# Patient Record
Sex: Female | Born: 1945 | Race: White | Hispanic: No | Marital: Married | Smoking: Never smoker
Health system: Southern US, Community
[De-identification: ages and names within clinical notes are randomized; demographics above are authoritative.]

## PROBLEM LIST (undated history)

## (undated) DIAGNOSIS — M5430 Sciatica, unspecified side: Secondary | ICD-10-CM

## (undated) DIAGNOSIS — E041 Nontoxic single thyroid nodule: Secondary | ICD-10-CM

## (undated) DIAGNOSIS — M1711 Unilateral primary osteoarthritis, right knee: Secondary | ICD-10-CM

## (undated) DIAGNOSIS — R519 Headache, unspecified: Secondary | ICD-10-CM

## (undated) DIAGNOSIS — R7881 Bacteremia: Secondary | ICD-10-CM

## (undated) DIAGNOSIS — M48062 Spinal stenosis, lumbar region with neurogenic claudication: Secondary | ICD-10-CM

## (undated) DIAGNOSIS — K219 Gastro-esophageal reflux disease without esophagitis: Secondary | ICD-10-CM

## (undated) DIAGNOSIS — E669 Obesity, unspecified: Secondary | ICD-10-CM

## (undated) DIAGNOSIS — I10 Essential (primary) hypertension: Secondary | ICD-10-CM

## (undated) DIAGNOSIS — K760 Fatty (change of) liver, not elsewhere classified: Secondary | ICD-10-CM

## (undated) DIAGNOSIS — F32A Depression, unspecified: Secondary | ICD-10-CM

## (undated) DIAGNOSIS — K56609 Unspecified intestinal obstruction, unspecified as to partial versus complete obstruction: Secondary | ICD-10-CM

## (undated) DIAGNOSIS — M1712 Unilateral primary osteoarthritis, left knee: Secondary | ICD-10-CM

## (undated) DIAGNOSIS — M549 Dorsalgia, unspecified: Secondary | ICD-10-CM

## (undated) DIAGNOSIS — A498 Other bacterial infections of unspecified site: Secondary | ICD-10-CM

## (undated) DIAGNOSIS — G500 Trigeminal neuralgia: Secondary | ICD-10-CM

## (undated) DIAGNOSIS — D509 Iron deficiency anemia, unspecified: Secondary | ICD-10-CM

## (undated) DIAGNOSIS — M1990 Unspecified osteoarthritis, unspecified site: Secondary | ICD-10-CM

## (undated) DIAGNOSIS — M48061 Spinal stenosis, lumbar region without neurogenic claudication: Secondary | ICD-10-CM

## (undated) DIAGNOSIS — K529 Noninfective gastroenteritis and colitis, unspecified: Secondary | ICD-10-CM

## (undated) HISTORY — PX: OTHER SURGICAL HISTORY: SHX169

## (undated) HISTORY — DX: Trigeminal neuralgia: G50.0

## (undated) HISTORY — PX: COLECTOMY: SHX59

## (undated) HISTORY — DX: Gastro-esophageal reflux disease without esophagitis: K21.9

## (undated) HISTORY — PX: COLOSTOMY TAKEDOWN: SHX5258

## (undated) HISTORY — DX: Nontoxic single thyroid nodule: E04.1

## (undated) HISTORY — DX: Obesity, unspecified: E66.9

## (undated) HISTORY — DX: Depression, unspecified: F32.A

## (undated) HISTORY — DX: Unspecified intestinal obstruction, unspecified as to partial versus complete obstruction: K56.609

## (undated) HISTORY — DX: Fatty (change of) liver, not elsewhere classified: K76.0

## (undated) HISTORY — DX: Iron deficiency anemia, unspecified: D50.9

## (undated) HISTORY — PX: COLONOSCOPY: SHX174

## (undated) HISTORY — DX: Noninfective gastroenteritis and colitis, unspecified: K52.9

## (undated) HISTORY — PX: APPENDECTOMY: SHX54

## (undated) HISTORY — PX: COLOSTOMY: SHX63

---

## 2004-07-03 ENCOUNTER — Ambulatory Visit: Payer: Self-pay | Admitting: Internal Medicine

## 2004-07-03 IMAGING — US ULTRASOUND RIGHT BREAST
1 series · 8 of 8 positions shown · non-contrast
Comparison: none

REASON FOR EXAM: New found RIGHT outer quadrant lump lump
COMMENTS:

[Series 1: ultrasound right breast · 8 of 8 slices shown]
[im 1/8]
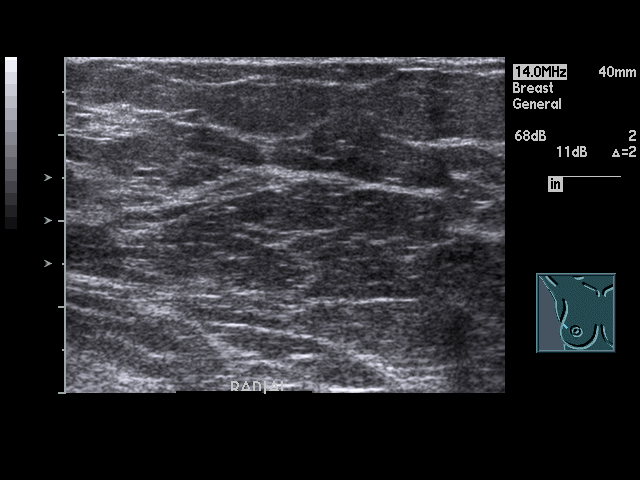
[im 2/8]
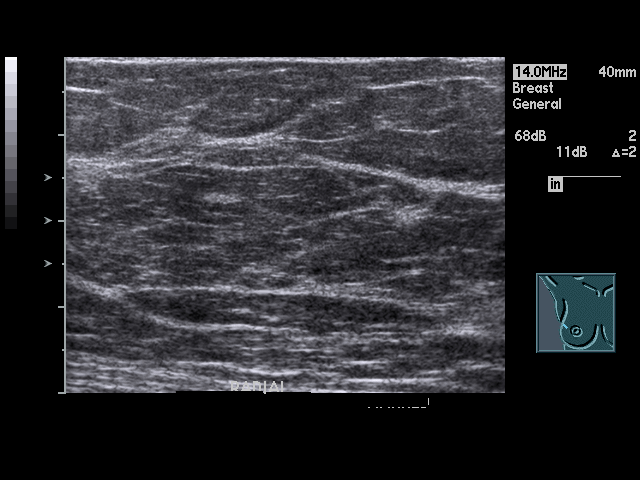
[im 3/8]
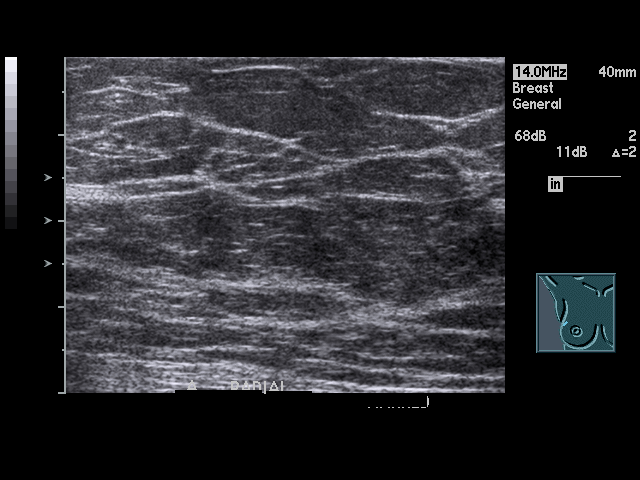
[im 4/8]
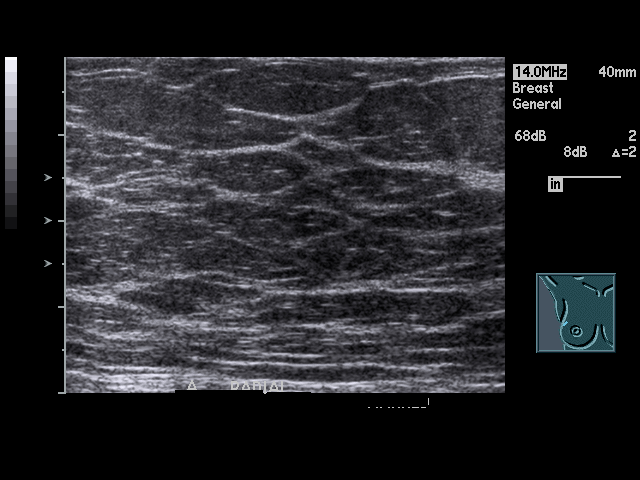
[im 5/8]
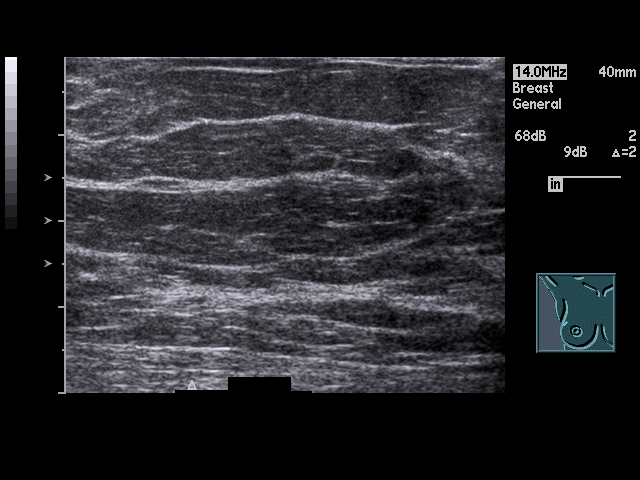
[im 6/8]
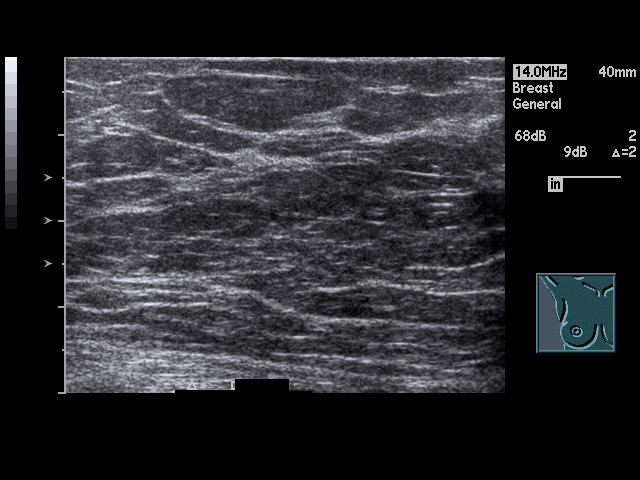
[im 7/8]
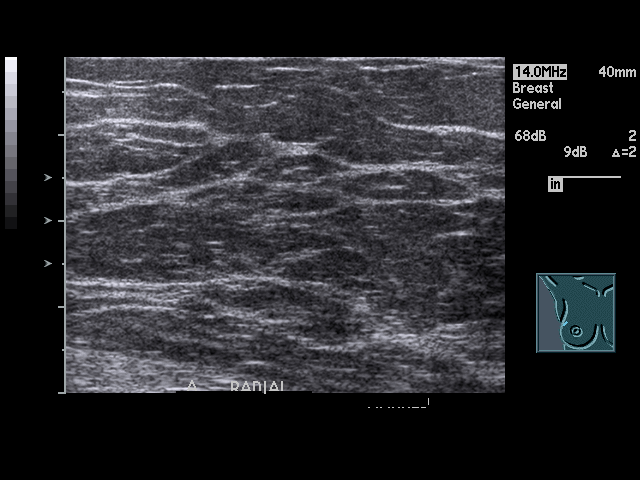
[im 8/8]
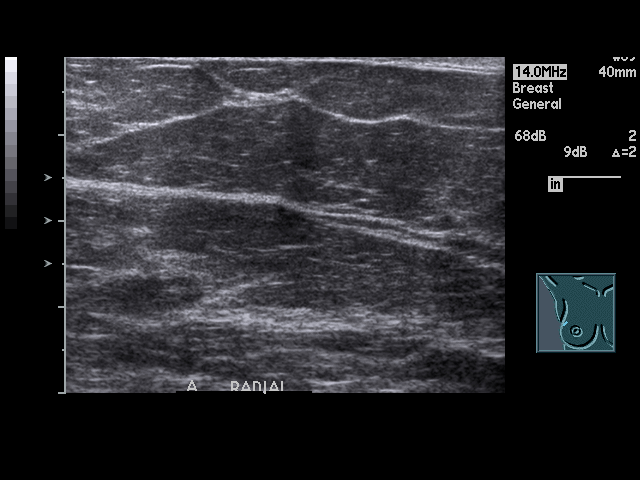

[8 of 8 positions shown; findings below may reference images not displayed]

PROCEDURE:     US  - US BREAST RIGHT  - [DATE] [DATE]

RESULT:     RIGHT breast ultrasound targeted to the 10 o'clock region where
the patient reports a palpable area of concern shows no specific sonographic
abnormalities.  No solid or cystic mass lesions are seen.  There is no
distortion of the breast architecture.
IMPRESSION: No significant sonographic abnormalities are noted.

## 2006-01-09 ENCOUNTER — Ambulatory Visit: Payer: Self-pay | Admitting: Internal Medicine

## 2007-01-12 ENCOUNTER — Ambulatory Visit: Payer: Self-pay | Admitting: Internal Medicine

## 2008-05-24 ENCOUNTER — Ambulatory Visit: Payer: Self-pay | Admitting: Internal Medicine

## 2009-11-23 ENCOUNTER — Ambulatory Visit: Payer: Self-pay | Admitting: Internal Medicine

## 2011-02-12 ENCOUNTER — Ambulatory Visit: Payer: Self-pay | Admitting: Internal Medicine

## 2012-06-11 ENCOUNTER — Ambulatory Visit: Payer: Self-pay

## 2012-06-11 IMAGING — MG MM CAD SCREENING MAMMO
1 series · 5 of 5 positions shown · non-contrast
Comparison: none

REASON FOR EXAM: SCR MAMMO NO ORDER
COMMENTS:

[R CC · right · 5 of 5 slices shown]
[im 1/5]
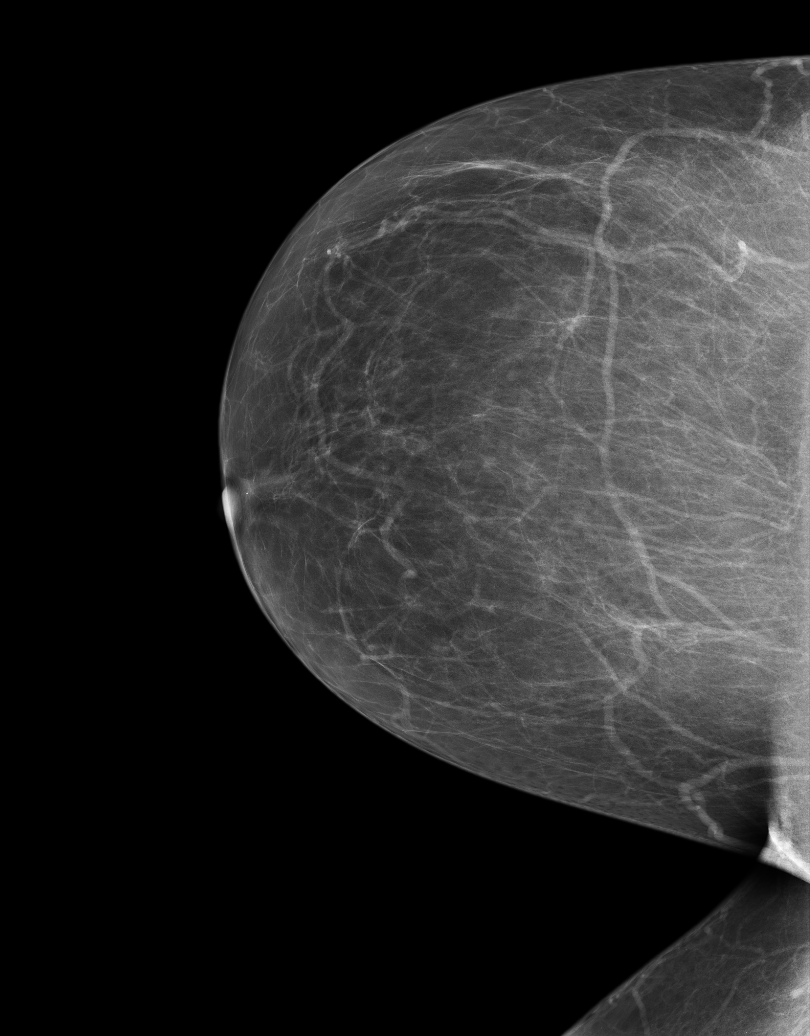
[im 2/5]
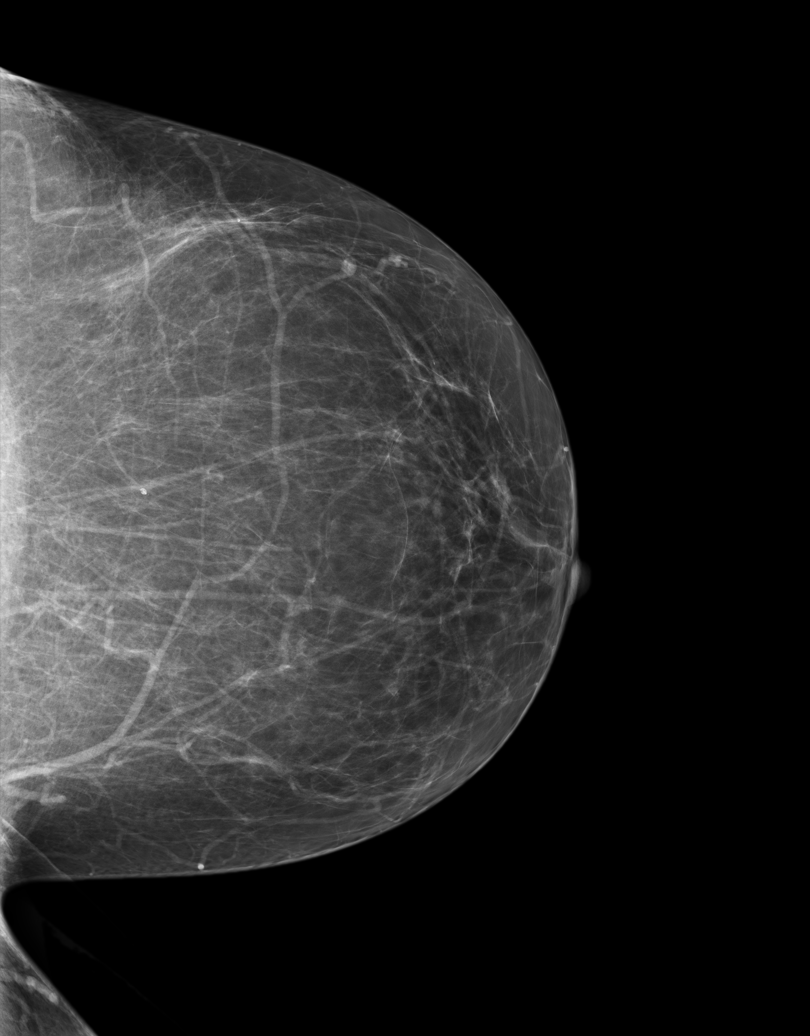
[im 3/5]
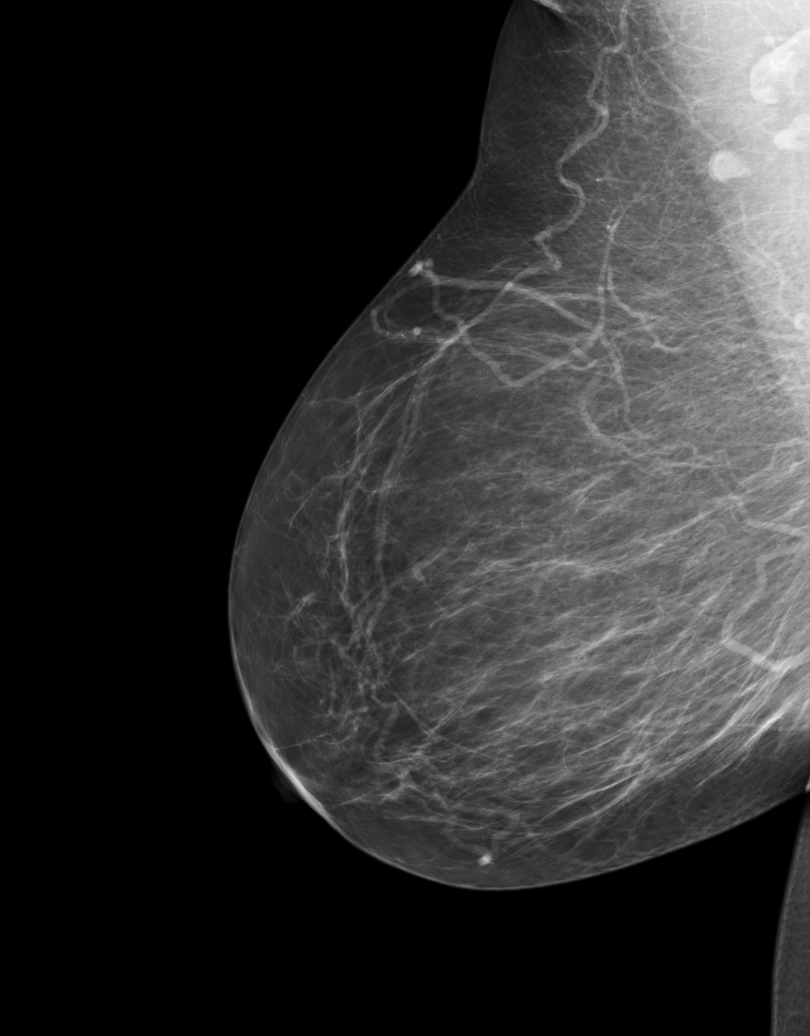
[im 4/5]
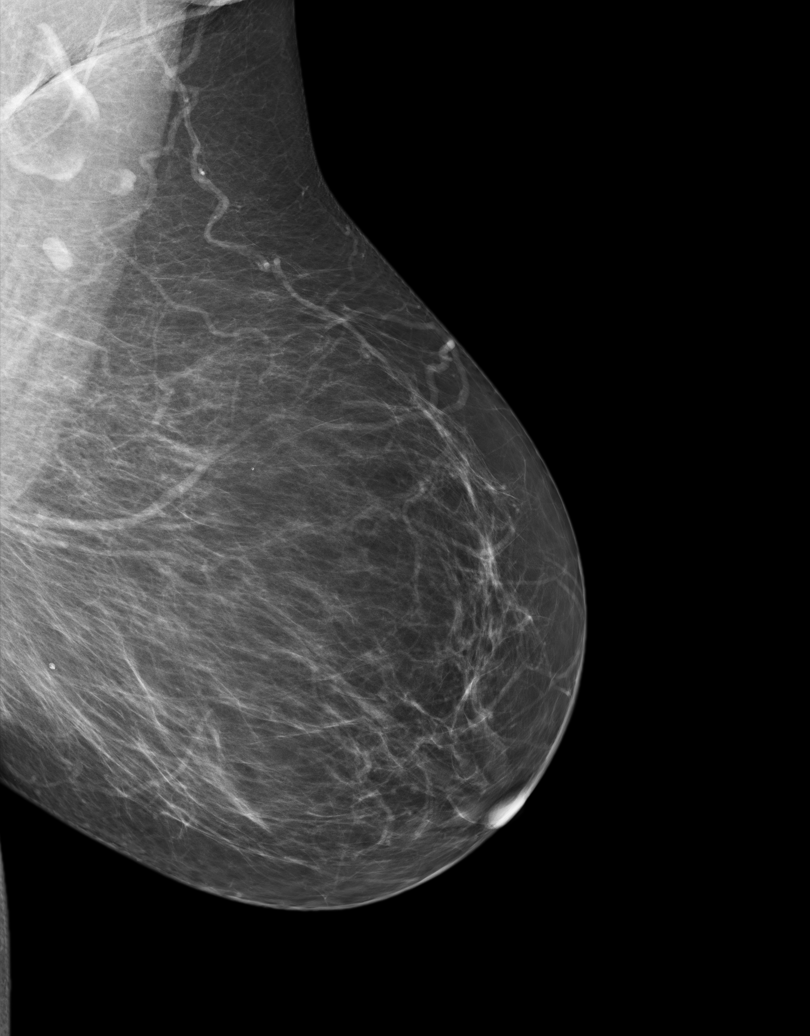
[im 5/5]
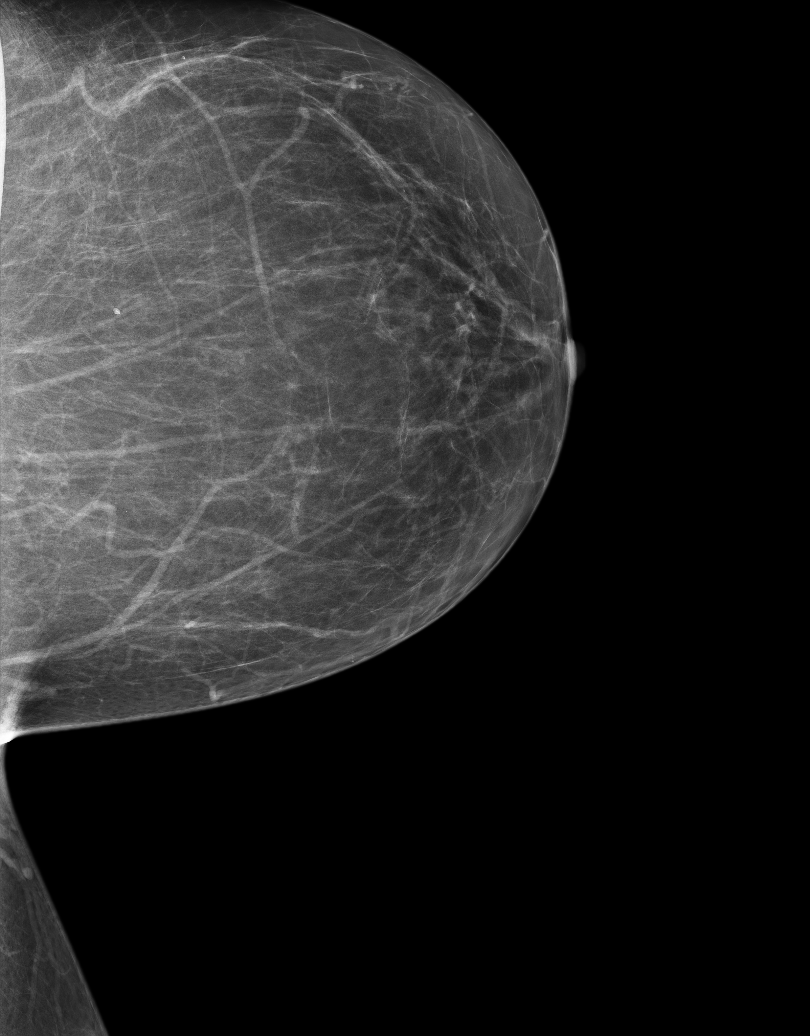

[5 of 5 positions shown; findings below may reference images not displayed]

PROCEDURE:     MAM - MAM DGTL SCRN MAM NO ORDER W/CAD  - [DATE]  [DATE]

RESULT:     Comparison is made to previous digital studies [DATE],[DATE], and [DATE].

The breasts exhibit a predominantly fatty replaced parenchymal pattern.
There are benign-appearing lymph nodes in the axillary regions. There is no
dominant mass. There are no malignant appearing groupings of
microcalcification. No area of new architectural distortion is seen.
IMPRESSION: There are no findings suspicious for malignancy.

BI-RADS one: Negative mammogram.

Recommendation: Please continue to encourage yearly mammographic followup.

BREAST COMPOSITION: The breast composition is ALMOST ENTIRELY FATTY
(glandular tissue is less than 25%)

A NEGATIVE MAMMOGRAM REPORT DOES NOT PRECLUDE BIOPSY OR OTHER EVALUATION OF
A CLINICALLY PALPABLE OR OTHERWISE SUSPICIOUS MASS OR LESION. BREAST CANCER
MAY NOT BE DETECTED BY MAMMOGRAPHY IN UP TO 10% OF CASES.

Dictation site:1

## 2013-08-31 ENCOUNTER — Ambulatory Visit: Payer: Self-pay

## 2013-08-31 IMAGING — MG MM DIGITAL SCREENING BILAT W/ CAD
1 series · 4 of 4 positions shown · non-contrast
Comparison: Previous exam(s)

ACR Breast Density Category a: The breast tissue is almost entirely
fatty.

CLINICAL DATA: Screening.

EXAM:
DIGITAL SCREENING BILATERAL MAMMOGRAM WITH CAD

[R CC · right · 4 of 4 slices shown]
[im 1/4]
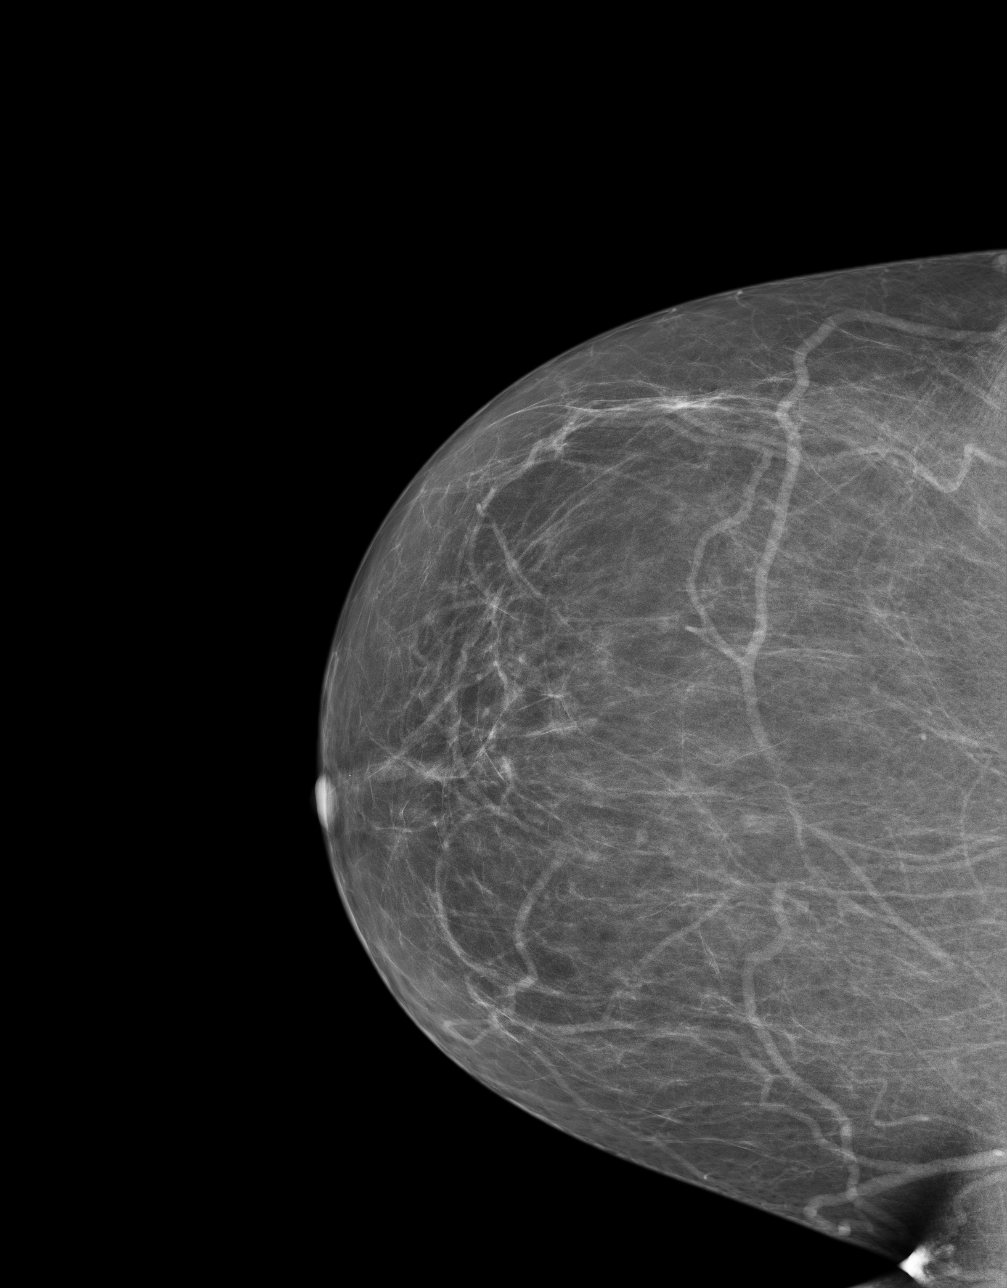
[im 2/4]
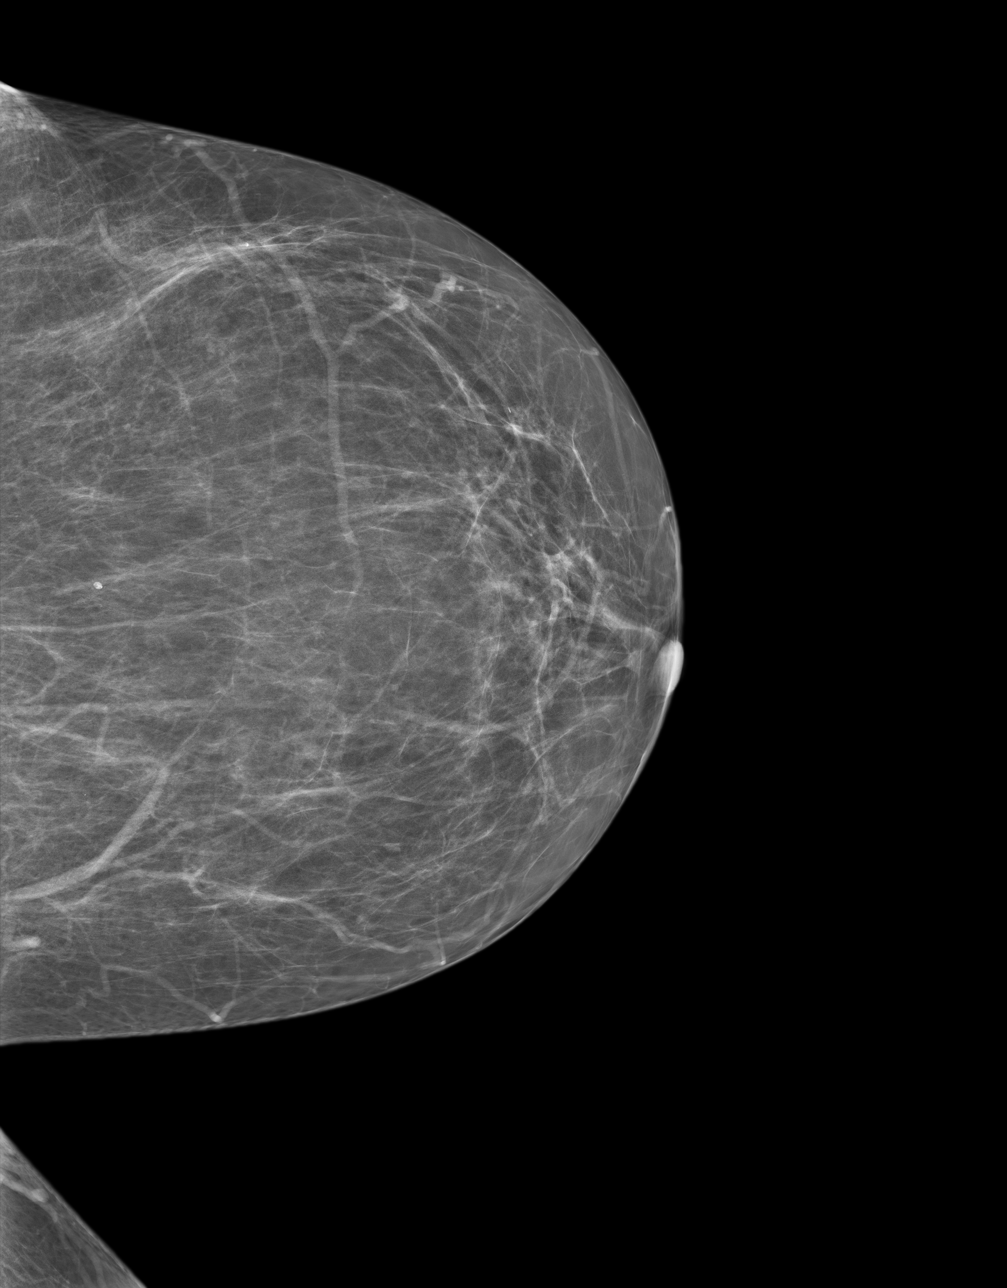
[im 3/4]
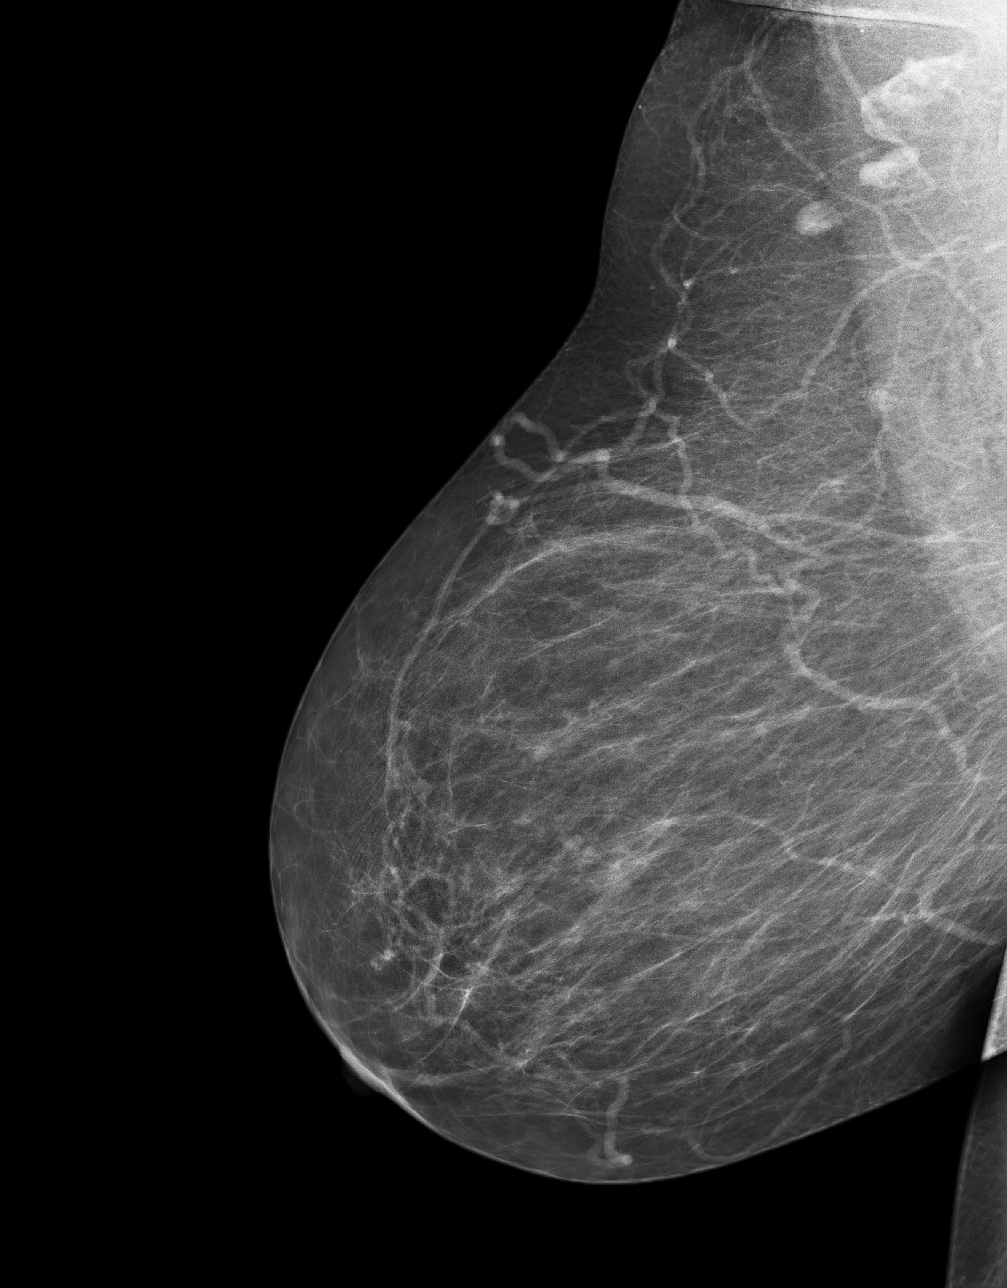
[im 4/4]
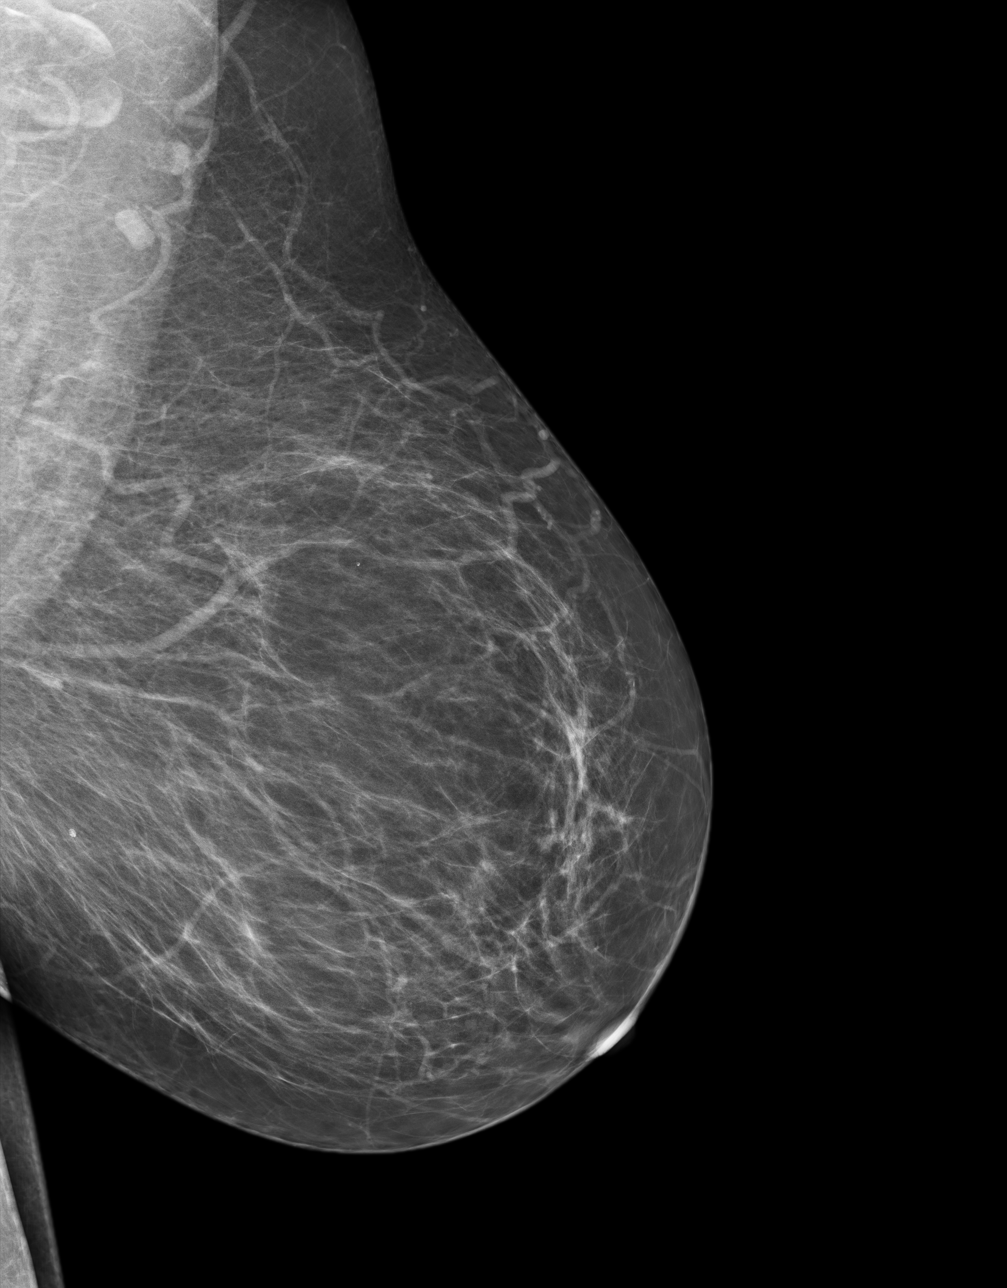

[4 of 4 positions shown; findings below may reference images not displayed]

FINDINGS: There are no findings suspicious for malignancy. Images were
processed with CAD.
IMPRESSION: No mammographic evidence of malignancy. A result letter of this
screening mammogram will be mailed directly to the patient.

RECOMMENDATION:
Screening mammogram in one year. (Code:[0Q])

BI-RADS CATEGORY  1: Negative.

## 2014-11-09 ENCOUNTER — Other Ambulatory Visit: Payer: Self-pay | Admitting: Infectious Diseases

## 2014-11-09 DIAGNOSIS — Z1231 Encounter for screening mammogram for malignant neoplasm of breast: Secondary | ICD-10-CM

## 2014-12-06 ENCOUNTER — Ambulatory Visit
Admission: RE | Admit: 2014-12-06 | Discharge: 2014-12-06 | Disposition: A | Discharge status: Home / Self Care | Payer: Medicare Other | Source: Ambulatory Visit | Attending: Infectious Diseases | Admitting: Infectious Diseases

## 2014-12-06 ENCOUNTER — Other Ambulatory Visit: Payer: Self-pay | Admitting: Infectious Diseases

## 2014-12-06 DIAGNOSIS — Z1231 Encounter for screening mammogram for malignant neoplasm of breast: Secondary | ICD-10-CM

## 2014-12-06 IMAGING — MG MM SCREENING BREAST TOMO BILATERAL
8 of 12 series · 8 of 28 positions shown · non-contrast
Comparison: Previous exam(s).

CLINICAL DATA: Screening.

EXAM:
DIGITAL SCREENING BILATERAL MAMMOGRAM WITH 3D TOMO WITH CAD

[R CC]
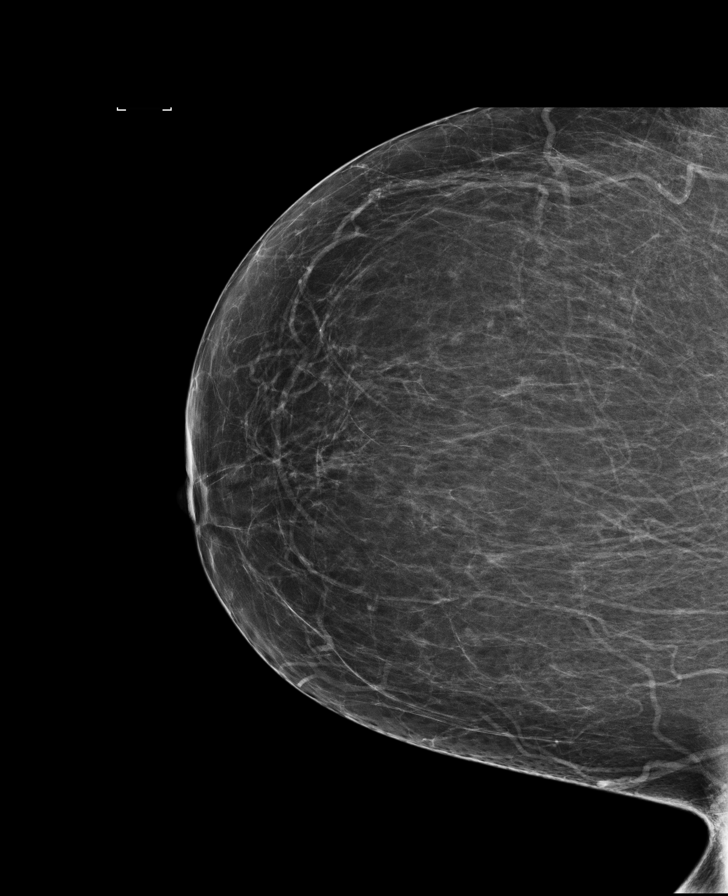

[L CC synth-2D]
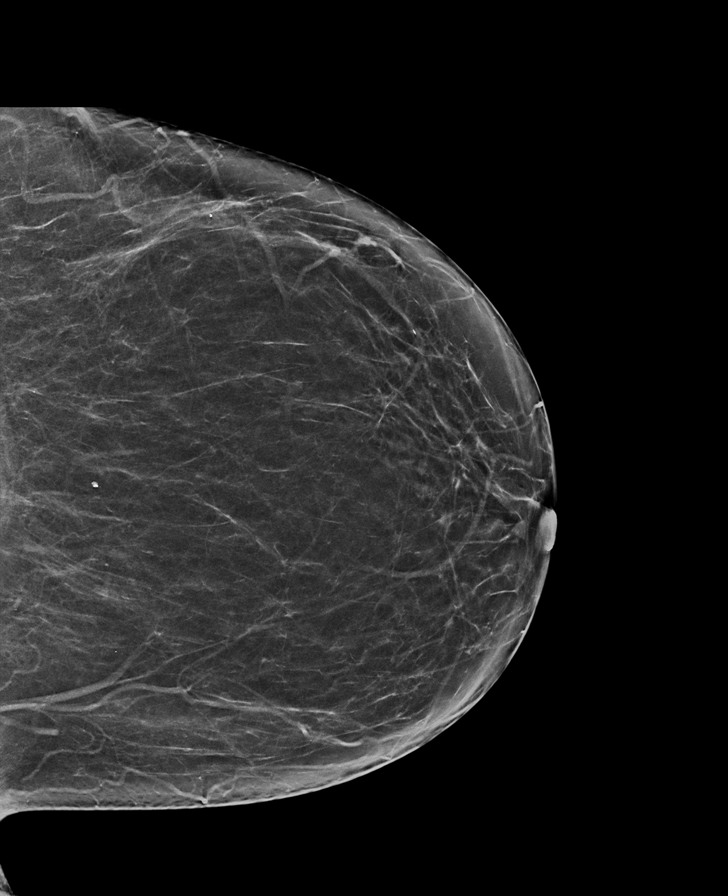

[L MLO synth-2D]
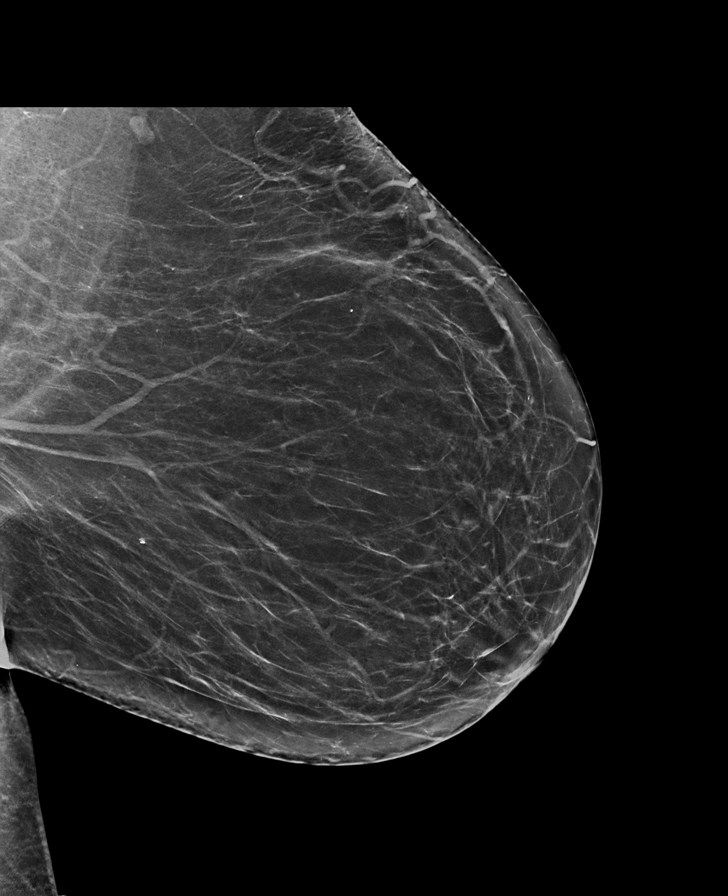

[L CC]
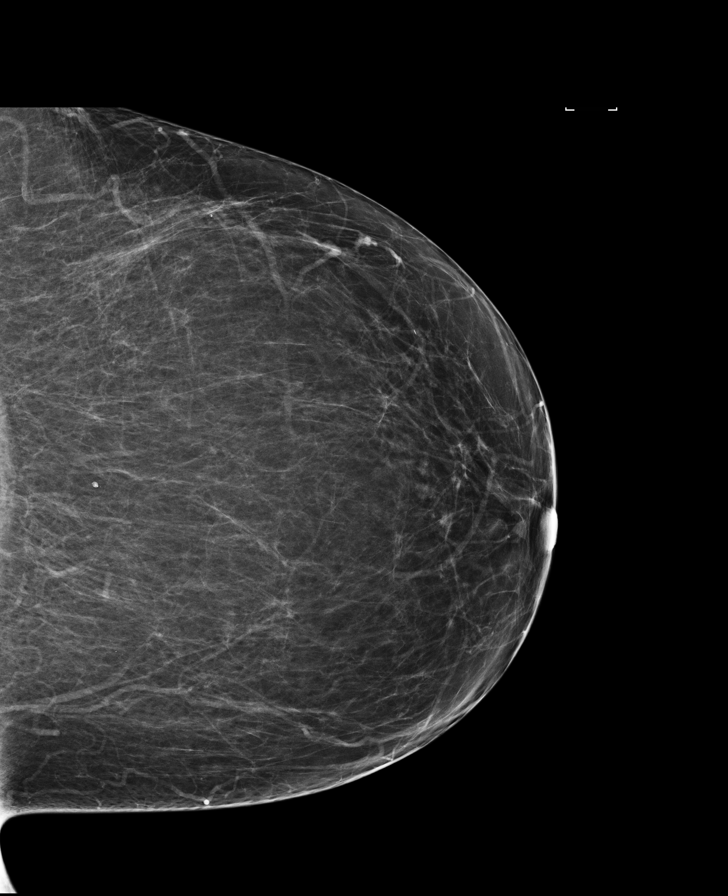

[R MLO]
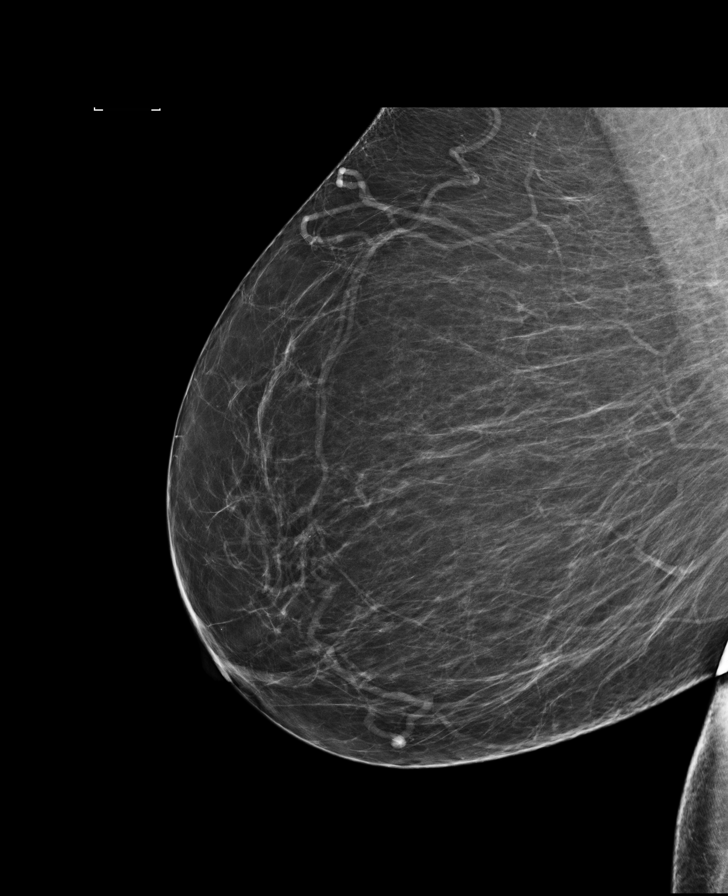

[L MLO]
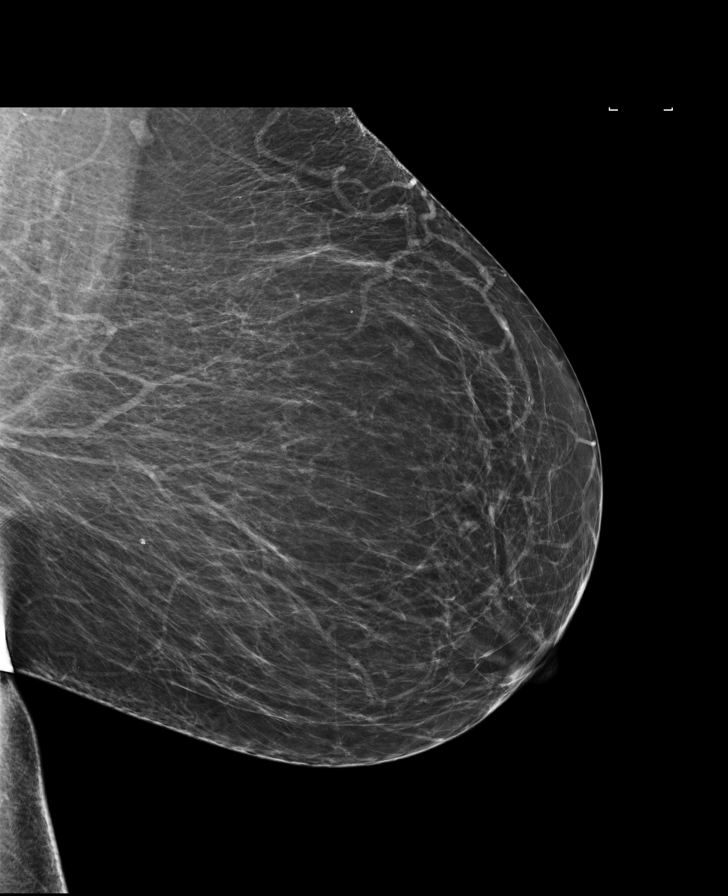

[R MLO synth-2D]
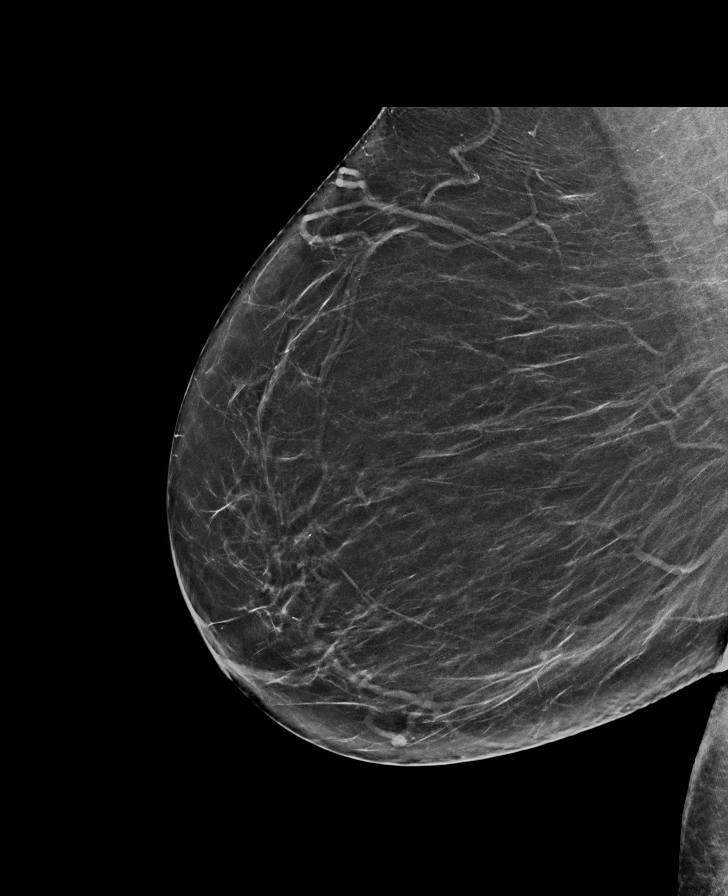

[R CC synth-2D]
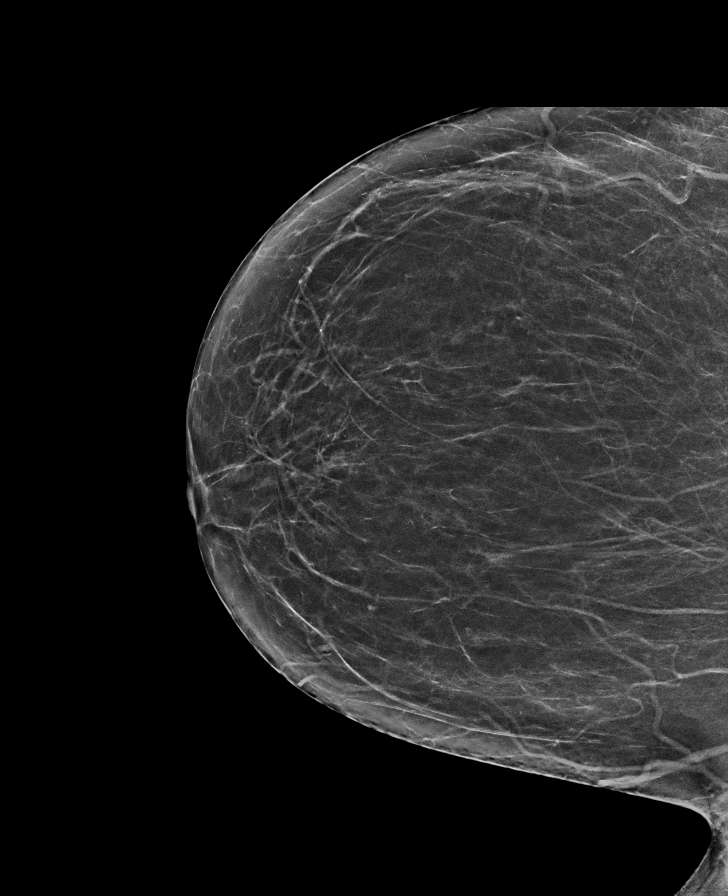

[8 of 28 positions shown; findings below may reference images not displayed]

ACR Breast Density Category b: There are scattered areas of
fibroglandular density.
FINDINGS: There are no findings suspicious for malignancy. Images were
processed with CAD.
IMPRESSION: No mammographic evidence of malignancy. A result letter of this
screening mammogram will be mailed directly to the patient.

RECOMMENDATION:
Screening mammogram in one year. (Code:[F0])

BI-RADS CATEGORY  1: Negative.

## 2015-04-11 DIAGNOSIS — M542 Cervicalgia: Secondary | ICD-10-CM | POA: Insufficient documentation

## 2015-04-28 DIAGNOSIS — E041 Nontoxic single thyroid nodule: Secondary | ICD-10-CM | POA: Insufficient documentation

## 2015-10-24 DIAGNOSIS — G500 Trigeminal neuralgia: Secondary | ICD-10-CM | POA: Insufficient documentation

## 2016-12-04 DIAGNOSIS — M549 Dorsalgia, unspecified: Secondary | ICD-10-CM | POA: Insufficient documentation

## 2016-12-04 DIAGNOSIS — M5430 Sciatica, unspecified side: Secondary | ICD-10-CM | POA: Insufficient documentation

## 2017-01-14 ENCOUNTER — Other Ambulatory Visit: Payer: Self-pay | Admitting: Physical Medicine and Rehabilitation

## 2017-01-14 DIAGNOSIS — M5416 Radiculopathy, lumbar region: Secondary | ICD-10-CM

## 2017-01-15 ENCOUNTER — Other Ambulatory Visit: Payer: Self-pay | Admitting: Infectious Diseases

## 2017-01-15 DIAGNOSIS — Z1231 Encounter for screening mammogram for malignant neoplasm of breast: Secondary | ICD-10-CM

## 2017-01-22 ENCOUNTER — Ambulatory Visit: Payer: Medicare Other

## 2017-02-03 ENCOUNTER — Ambulatory Visit
Admission: RE | Admit: 2017-02-03 | Discharge: 2017-02-03 | Disposition: A | Discharge status: Home / Self Care | Payer: Medicare Other | Source: Ambulatory Visit | Attending: Infectious Diseases | Admitting: Infectious Diseases

## 2017-02-03 DIAGNOSIS — Z1231 Encounter for screening mammogram for malignant neoplasm of breast: Secondary | ICD-10-CM | POA: Diagnosis present

## 2017-02-03 IMAGING — MG MM DIGITAL SCREENING BILAT W/ TOMO W/ CAD
8 of 12 series · 8 of 28 positions shown · non-contrast
Comparison: Previous exam(s).

CLINICAL DATA: Screening.

EXAM:
2D DIGITAL SCREENING BILATERAL MAMMOGRAM WITH CAD AND ADJUNCT TOMO

[L CC]
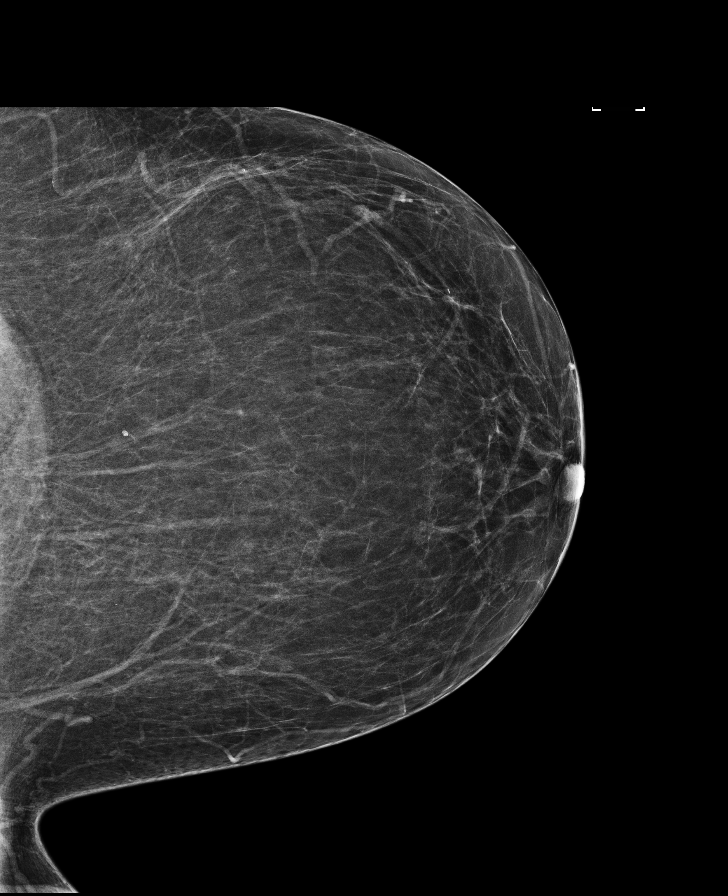

[R MLO synth-2D]
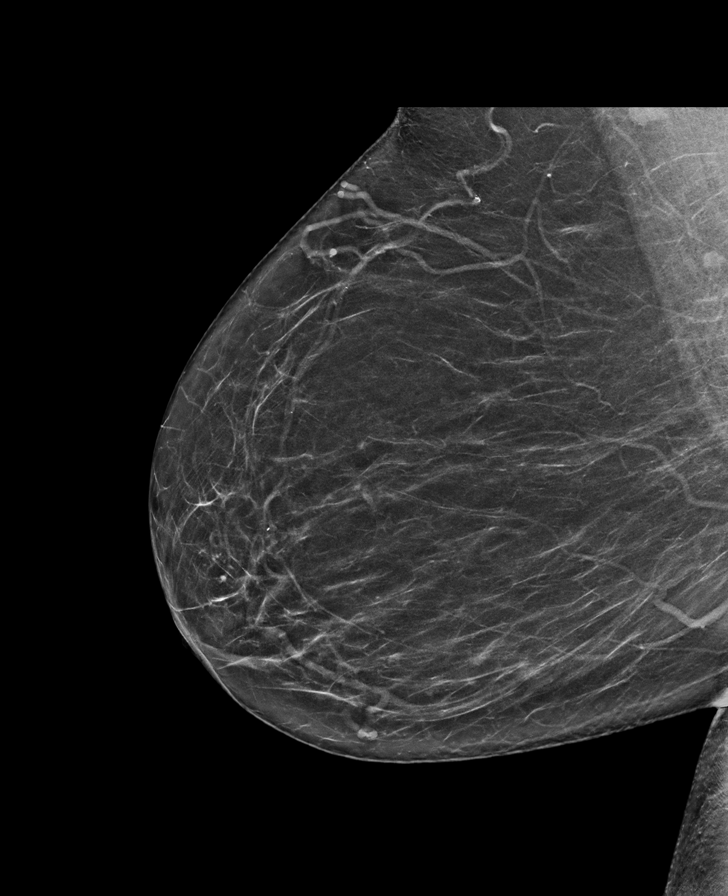

[L CC synth-2D]
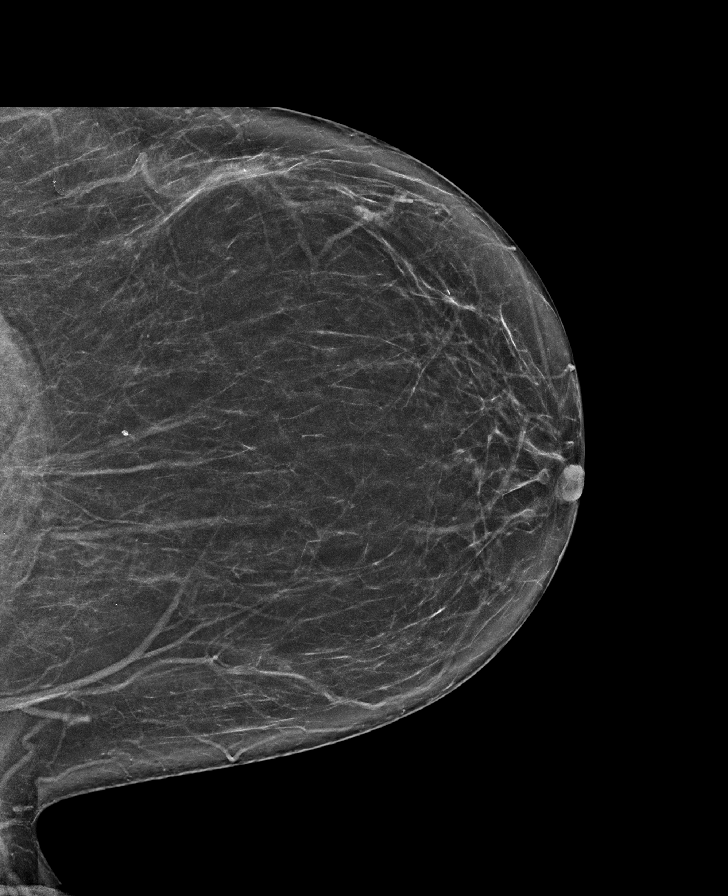

[L MLO]
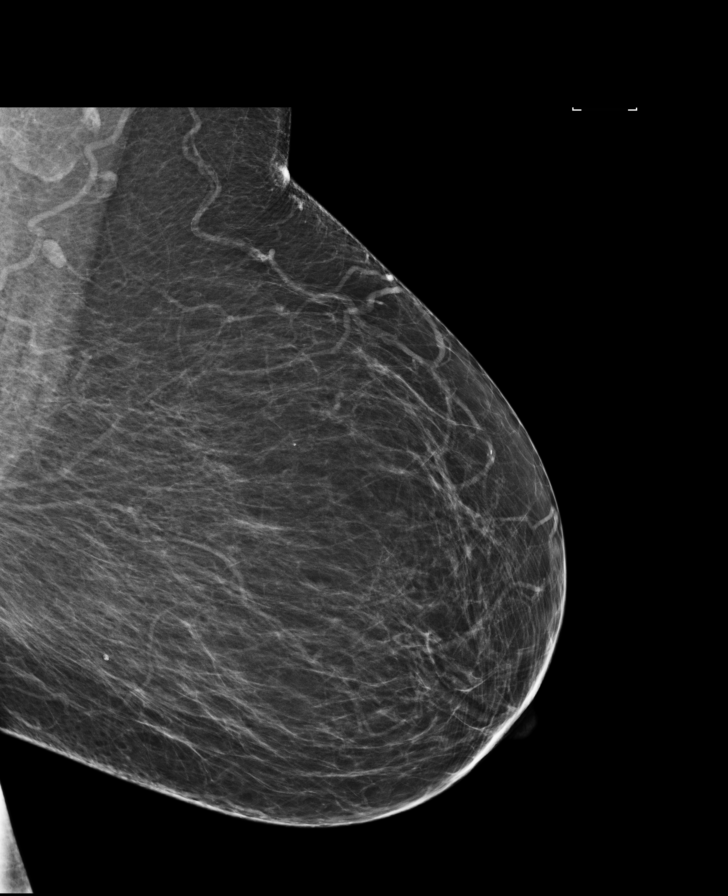

[R CC]
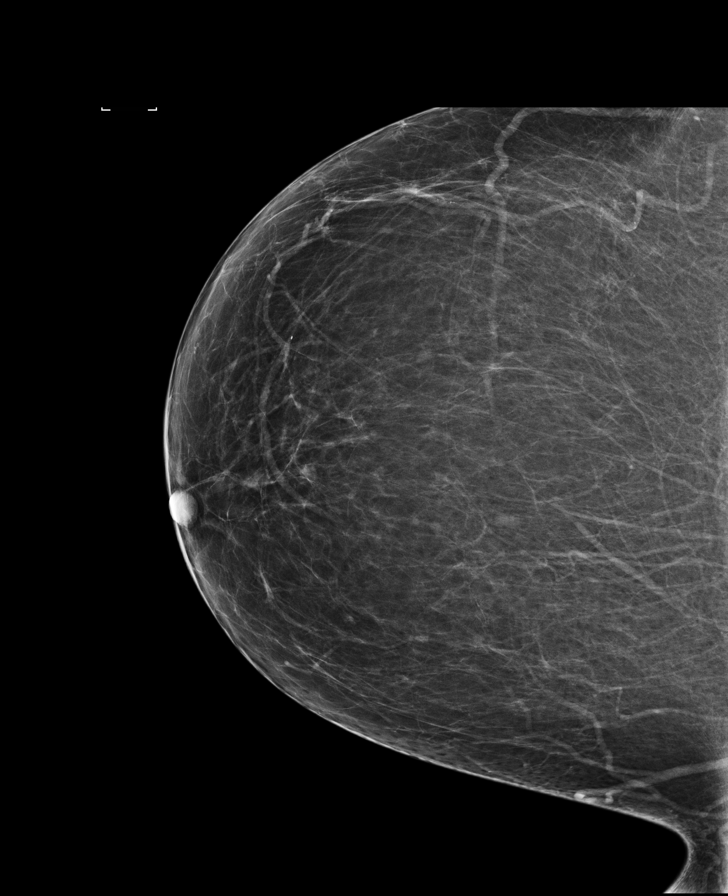

[R CC synth-2D]
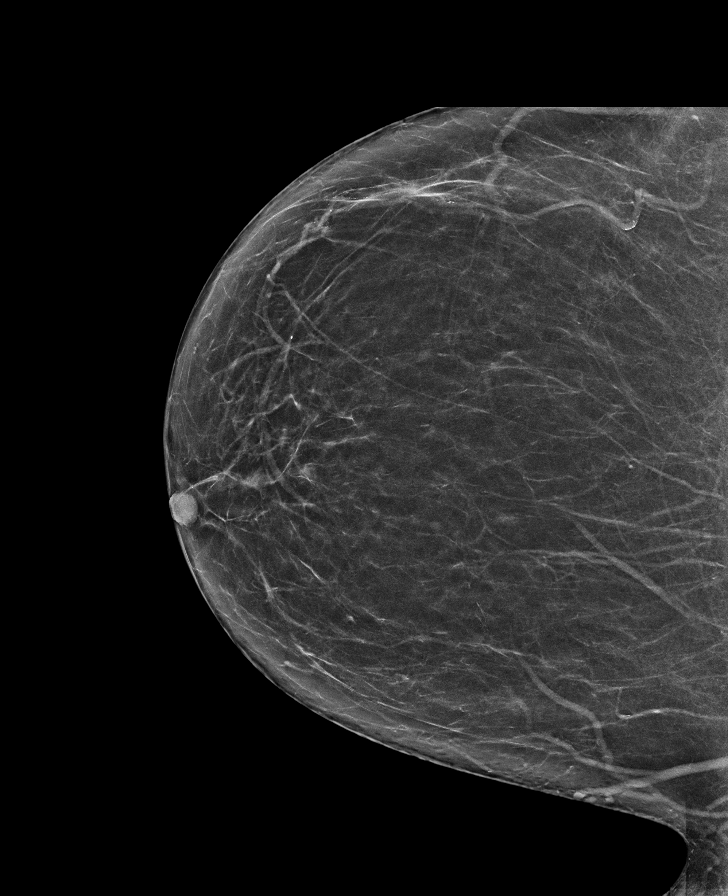

[R MLO]
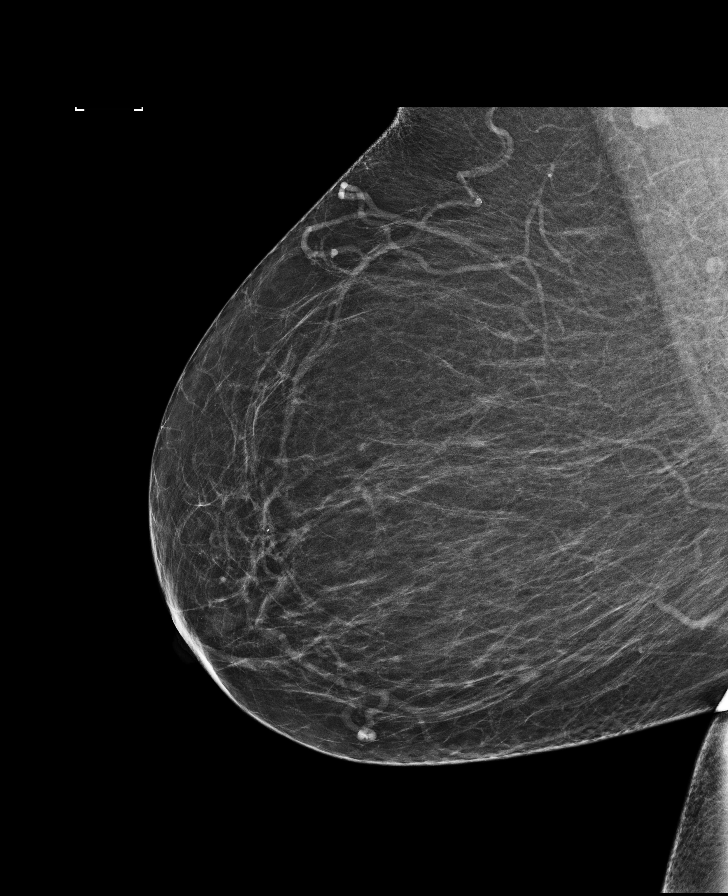

[L MLO synth-2D]
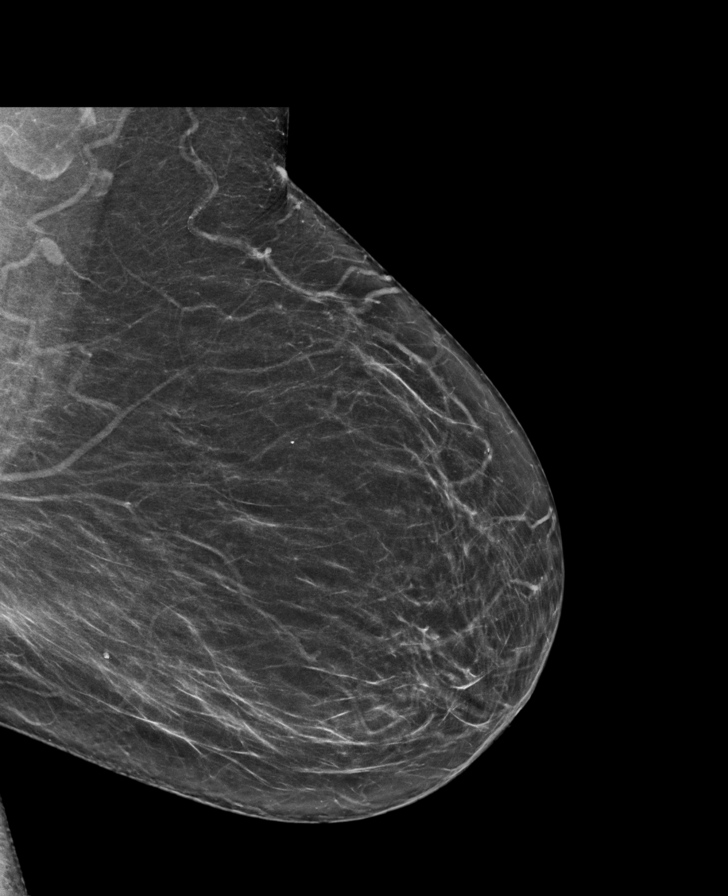

[8 of 28 positions shown; findings below may reference images not displayed]

ACR Breast Density Category b: There are scattered areas of
fibroglandular density.
FINDINGS: There are no findings suspicious for malignancy. Images were
processed with CAD.
IMPRESSION: No mammographic evidence of malignancy. A result letter of this
screening mammogram will be mailed directly to the patient.

RECOMMENDATION:
Screening mammogram in one year. (Code:[33])

BI-RADS CATEGORY  1: Negative.

## 2017-02-17 ENCOUNTER — Other Ambulatory Visit: Payer: Medicare Other

## 2017-12-05 IMAGING — US US ABDOMEN COMPLETE
1 series · 14 of 25 positions shown · non-contrast
Comparison: None.

CLINICAL DATA: Right upper quadrant pain for 2 days.

EXAM:
ABDOMEN ULTRASOUND COMPLETE

[Series 1: us abdomen complete · 0.26mm/px · 14 of 75 slices shown]
[im 1/75]
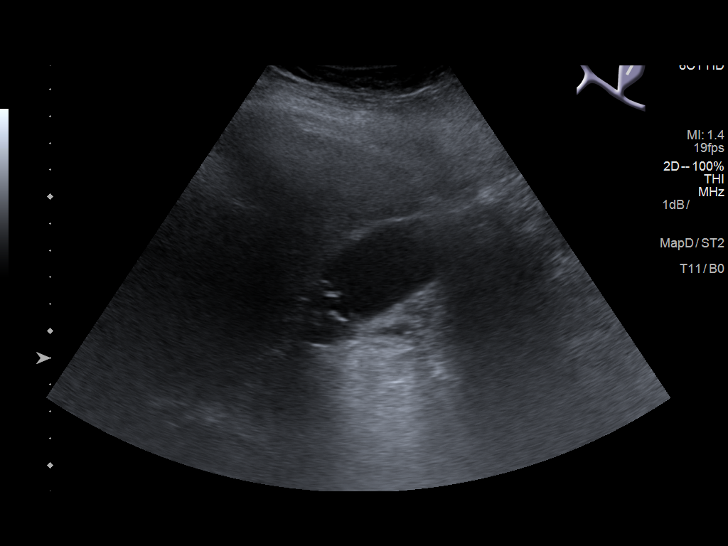
[im 7/75]
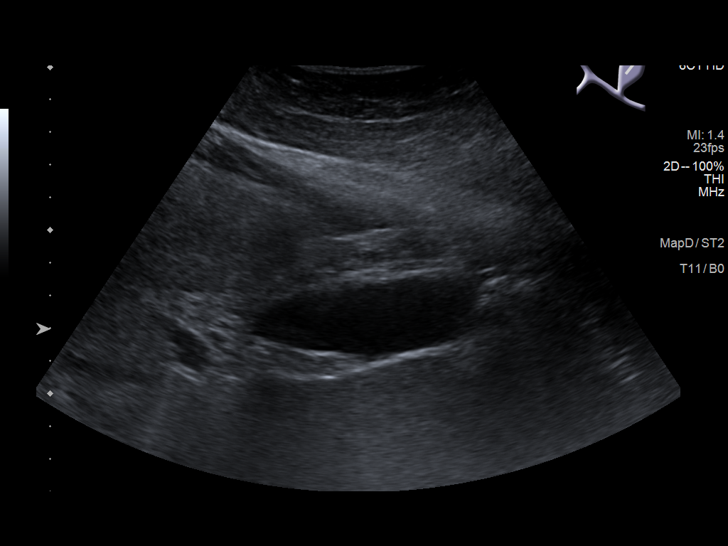
[im 13/75]
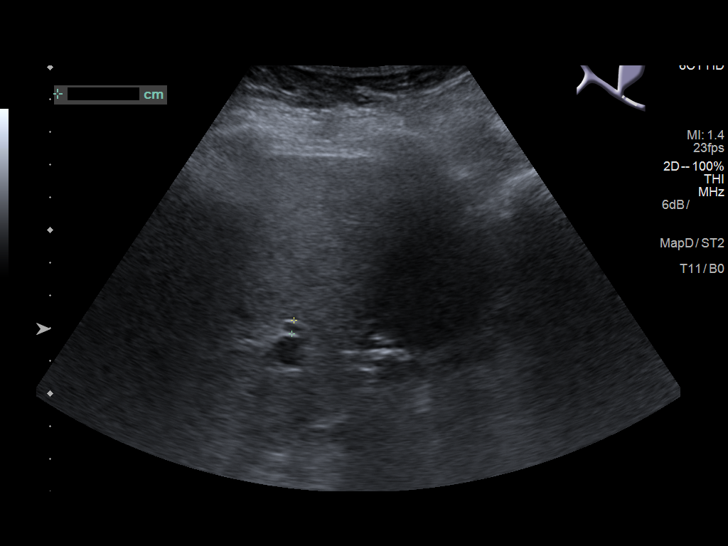
[im 19/75]
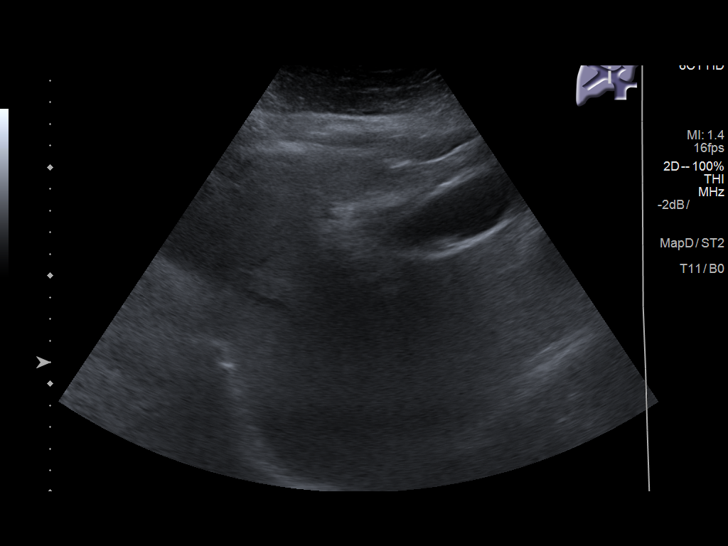
[im 25/75]
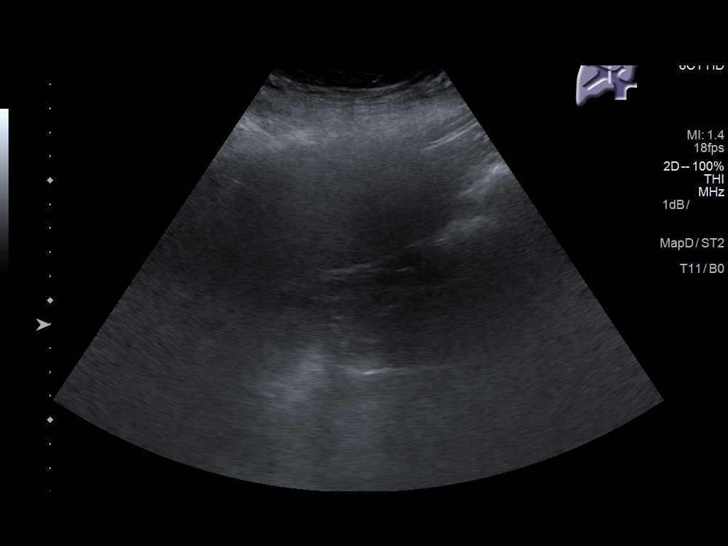
[im 28/75]
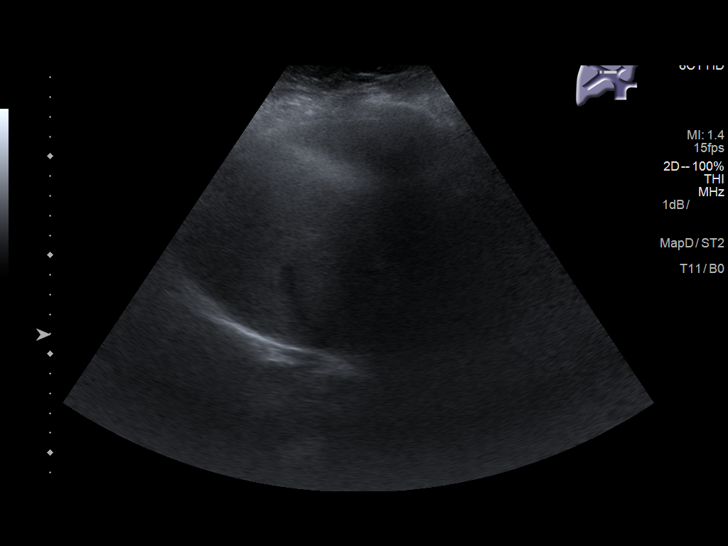
[im 34/75]
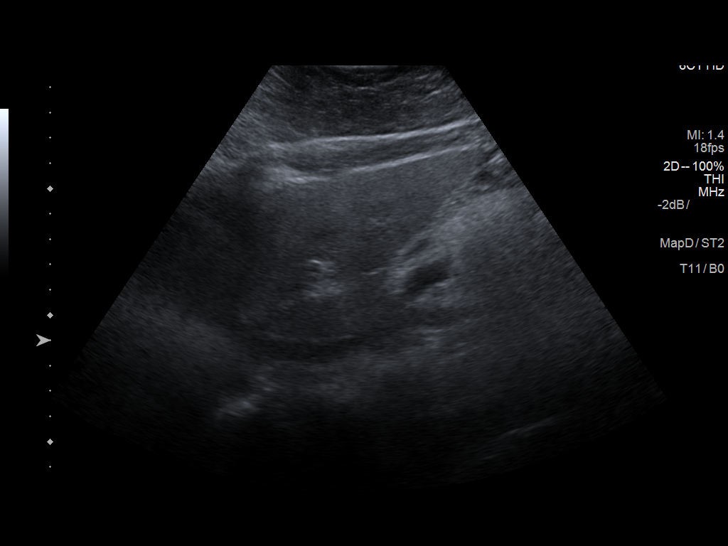
[im 41/75]
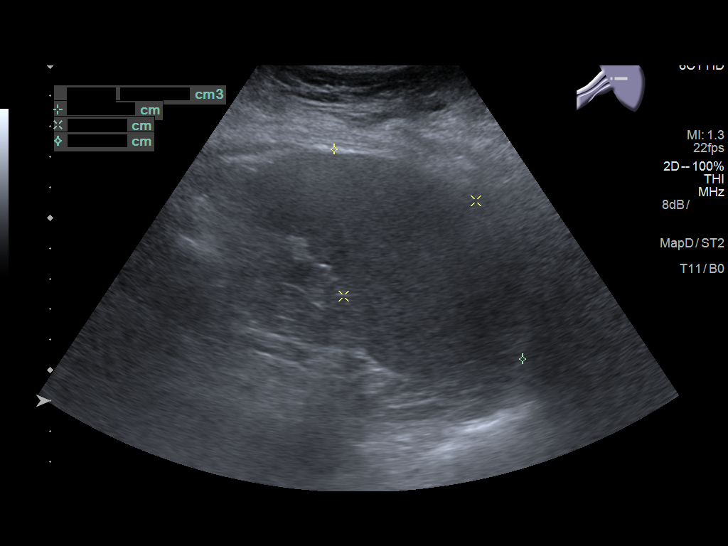
[im 47/75]
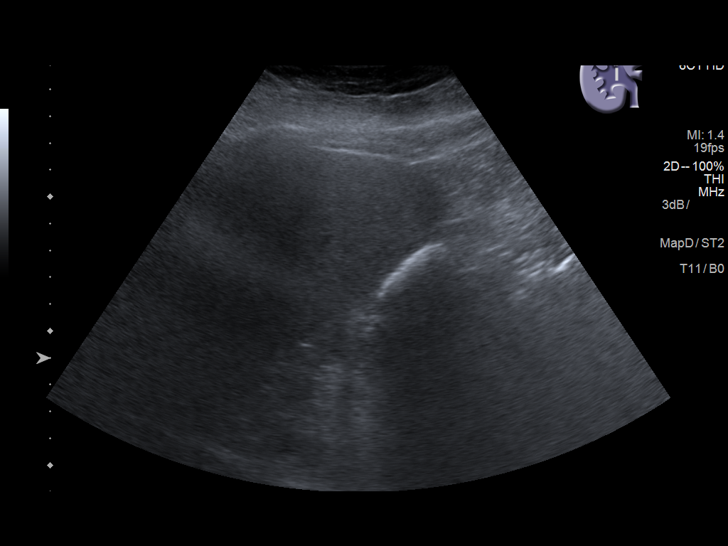
[im 50/75]
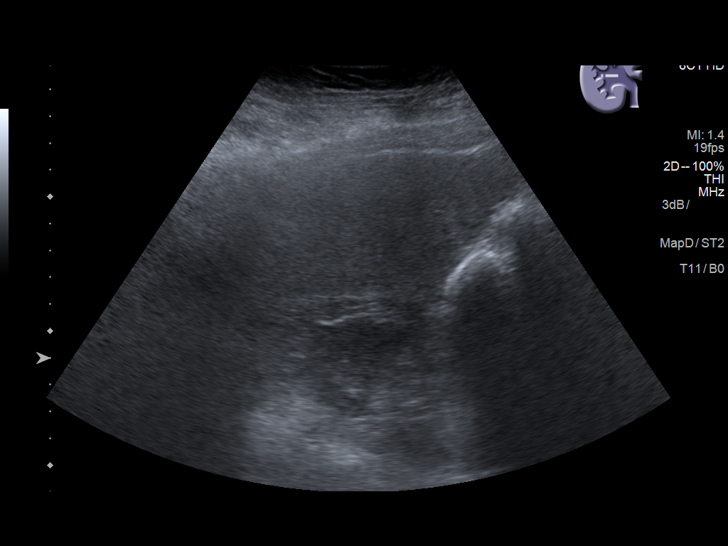
[im 56/75]
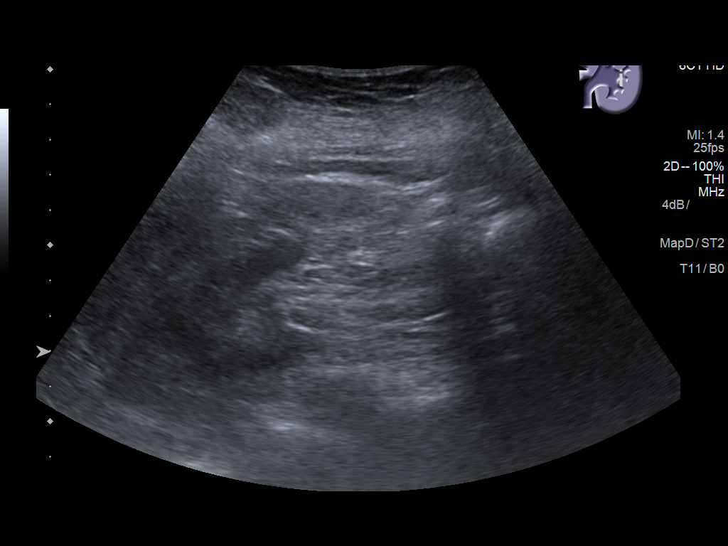
[im 62/75]
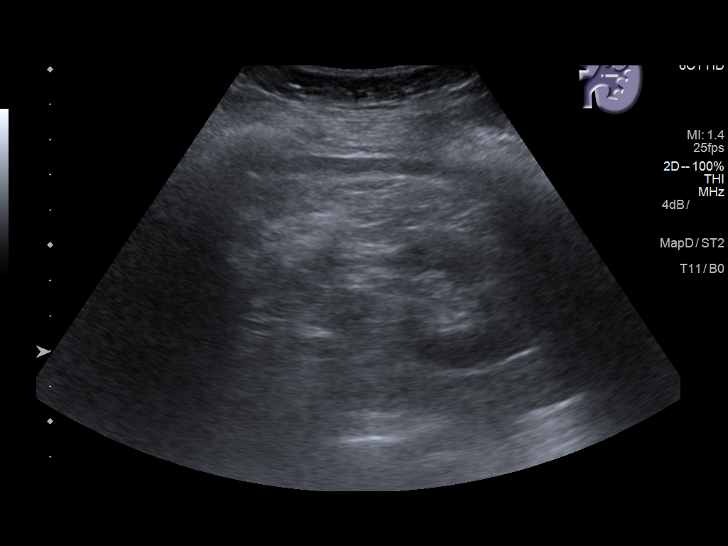
[im 68/75]
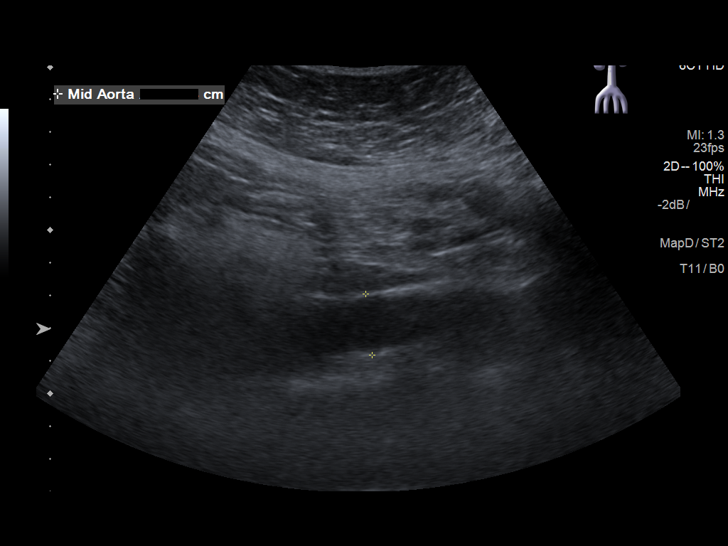
[im 75/75]
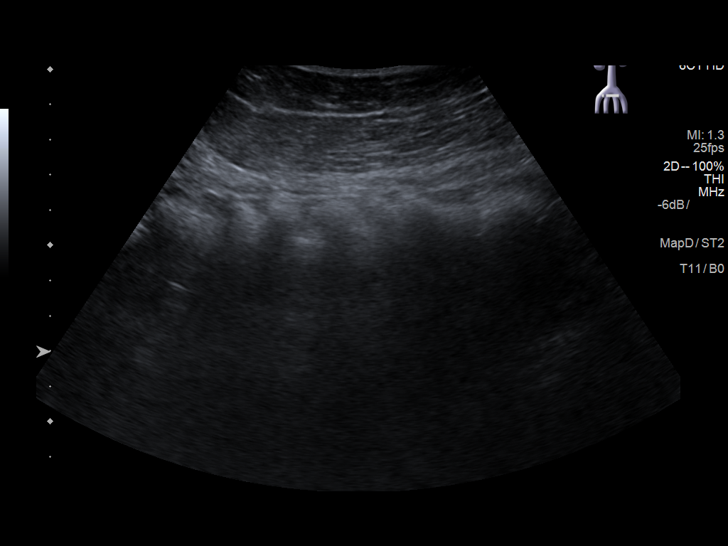

[14 of 25 positions shown; findings below may reference images not displayed]

FINDINGS: Gallbladder: Multiple small mobile gallstones. No gallbladder wall
thickening visualized. No sonographic Murphy sign noted by
sonographer.

Common bile duct: Diameter: 4.2 mm

Liver: No focal lesion identified. Mildly increased parenchymal
echogenicity. Portal vein is patent on color Doppler imaging with
normal direction of blood flow towards the liver.

IVC: No abnormality visualized.

Pancreas: Visualized portion unremarkable.

Spleen: Size and appearance within normal limits.

Right Kidney: Length: 9.3 cm. Echogenicity within normal limits. No
mass or hydronephrosis visualized.

Left Kidney: Length: 9.8 cm. Echogenicity within normal limits. No
mass or hydronephrosis visualized.

Abdominal aorta: No aneurysm visualized.

Other findings: None.
IMPRESSION: Cholelithiasis without sonographic evidence of acute cholecystitis.

Mild hepatic steatosis.

## 2018-02-03 ENCOUNTER — Other Ambulatory Visit: Payer: Self-pay | Admitting: Internal Medicine

## 2018-02-03 DIAGNOSIS — R1011 Right upper quadrant pain: Secondary | ICD-10-CM

## 2018-02-06 ENCOUNTER — Ambulatory Visit
Admission: RE | Admit: 2018-02-06 | Discharge: 2018-02-06 | Disposition: A | Discharge status: Home / Self Care | Payer: Medicare Other | Source: Ambulatory Visit | Attending: Internal Medicine | Admitting: Internal Medicine

## 2018-02-06 DIAGNOSIS — K760 Fatty (change of) liver, not elsewhere classified: Secondary | ICD-10-CM | POA: Diagnosis not present

## 2018-02-06 DIAGNOSIS — R1011 Right upper quadrant pain: Secondary | ICD-10-CM | POA: Diagnosis not present

## 2018-02-06 DIAGNOSIS — K8020 Calculus of gallbladder without cholecystitis without obstruction: Secondary | ICD-10-CM | POA: Diagnosis not present

## 2018-04-08 ENCOUNTER — Other Ambulatory Visit: Payer: Self-pay | Admitting: Internal Medicine

## 2018-04-08 DIAGNOSIS — Z1231 Encounter for screening mammogram for malignant neoplasm of breast: Secondary | ICD-10-CM

## 2018-05-06 DIAGNOSIS — R202 Paresthesia of skin: Secondary | ICD-10-CM | POA: Insufficient documentation

## 2018-05-06 DIAGNOSIS — R200 Anesthesia of skin: Secondary | ICD-10-CM | POA: Insufficient documentation

## 2018-05-14 ENCOUNTER — Other Ambulatory Visit: Payer: Self-pay | Admitting: Neurology

## 2018-05-14 ENCOUNTER — Other Ambulatory Visit (HOSPITAL_COMMUNITY): Payer: Self-pay | Admitting: Neurology

## 2018-05-14 DIAGNOSIS — R208 Other disturbances of skin sensation: Secondary | ICD-10-CM

## 2018-05-21 ENCOUNTER — Ambulatory Visit: Payer: Medicare Other

## 2019-01-13 ENCOUNTER — Other Ambulatory Visit: Payer: Self-pay | Admitting: Internal Medicine

## 2019-01-13 DIAGNOSIS — I10 Essential (primary) hypertension: Secondary | ICD-10-CM

## 2019-01-13 DIAGNOSIS — K8020 Calculus of gallbladder without cholecystitis without obstruction: Secondary | ICD-10-CM

## 2019-02-02 ENCOUNTER — Ambulatory Visit
Admission: RE | Admit: 2019-02-02 | Discharge: 2019-02-02 | Disposition: A | Discharge status: Home / Self Care | Payer: Medicare Other | Source: Ambulatory Visit | Attending: Internal Medicine | Admitting: Internal Medicine

## 2019-02-02 ENCOUNTER — Other Ambulatory Visit: Payer: Self-pay

## 2019-02-02 DIAGNOSIS — K8020 Calculus of gallbladder without cholecystitis without obstruction: Secondary | ICD-10-CM | POA: Insufficient documentation

## 2019-02-02 DIAGNOSIS — I10 Essential (primary) hypertension: Secondary | ICD-10-CM | POA: Diagnosis present

## 2019-02-02 IMAGING — US US ABDOMEN LIMITED
1 series · 14 of 25 positions shown · non-contrast
Comparison: Abdominal ultrasound dated [DATE]

CLINICAL DATA: 73-year-old female with elevated LFTs.

EXAM:
ULTRASOUND ABDOMEN LIMITED RIGHT UPPER QUADRANT

[Series 1: us abdomen limited · 0.22mm/px · 14 of 37 slices shown]
[im 1/37]
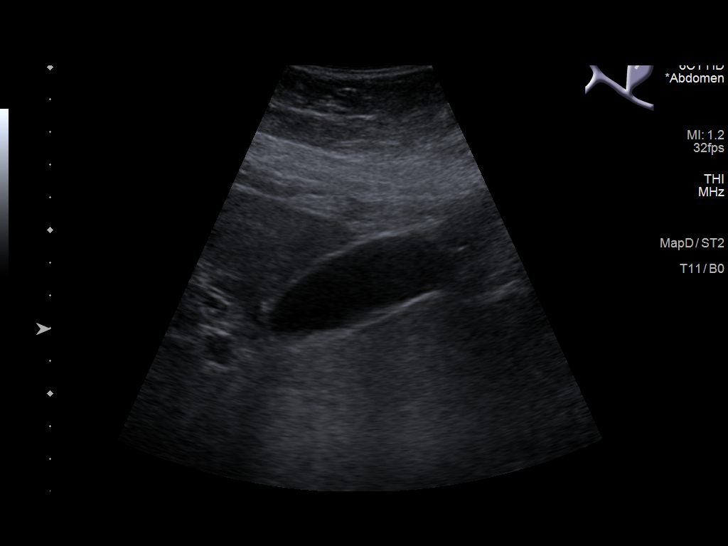
[im 4/37]
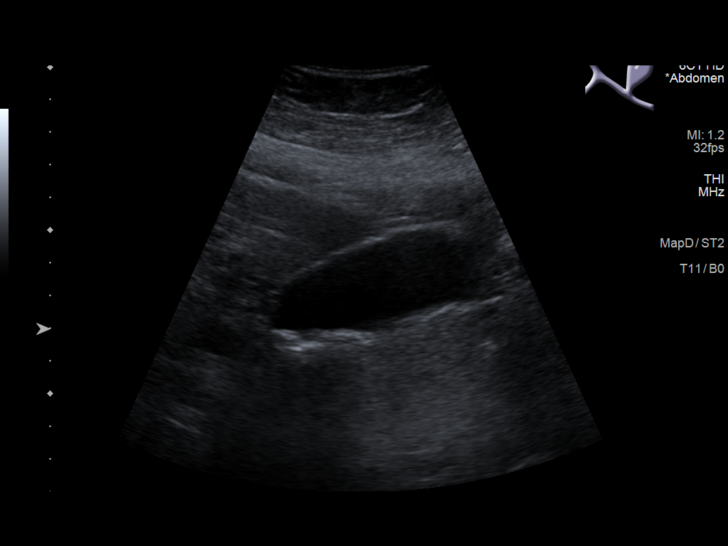
[im 7/37]
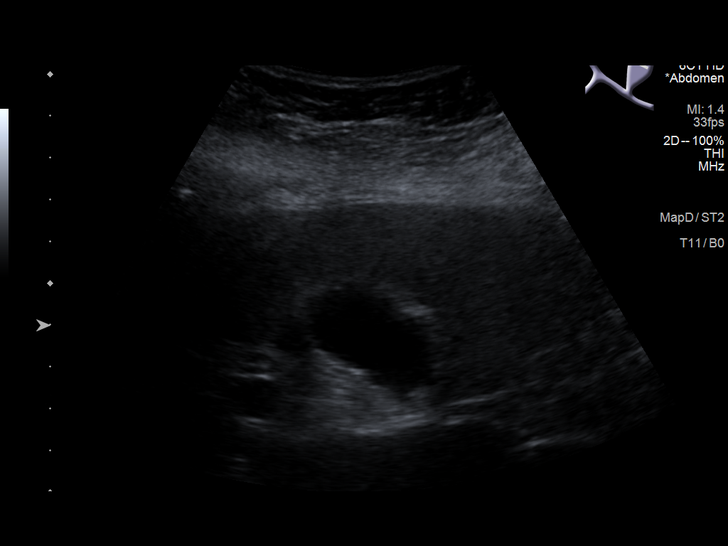
[im 10/37]
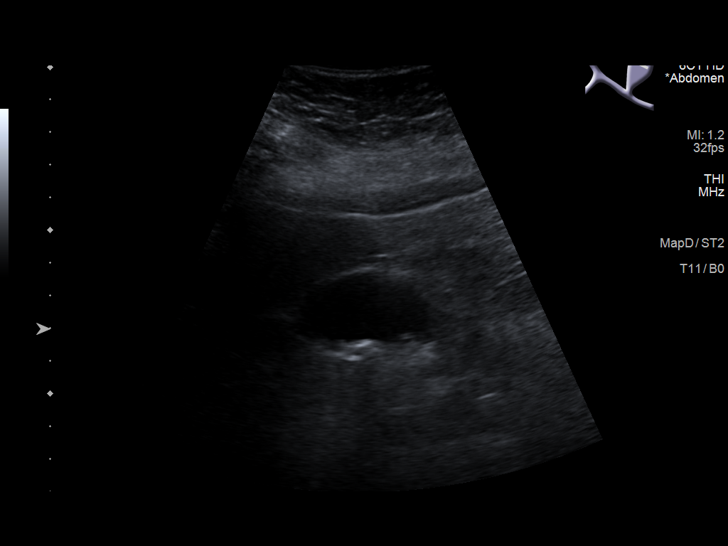
[im 13/37]
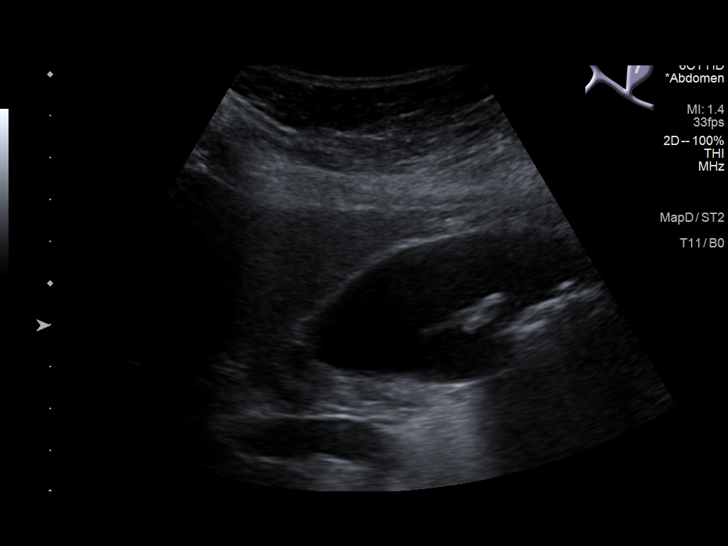
[im 14/37]
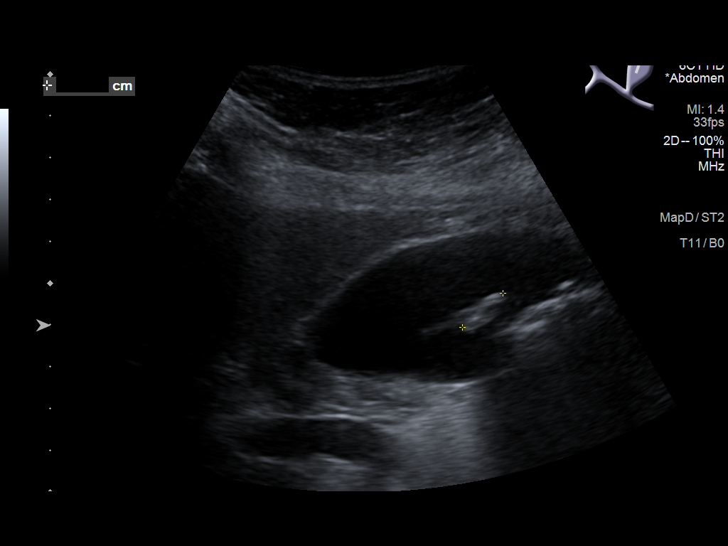
[im 17/37]
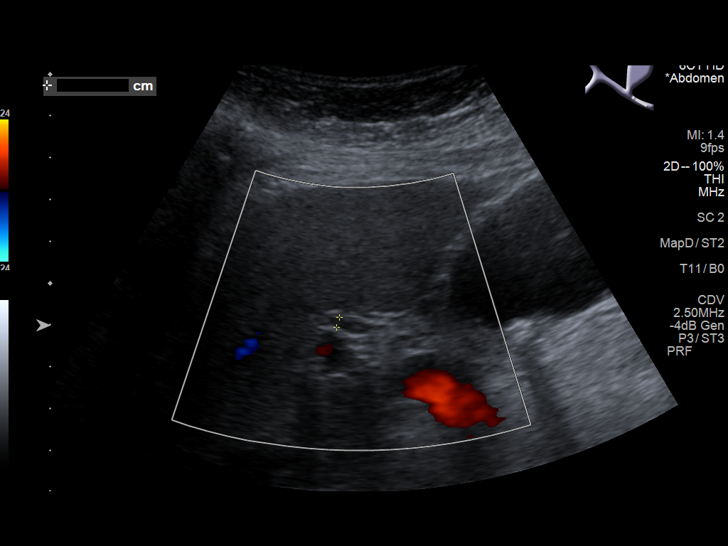
[im 20/37]
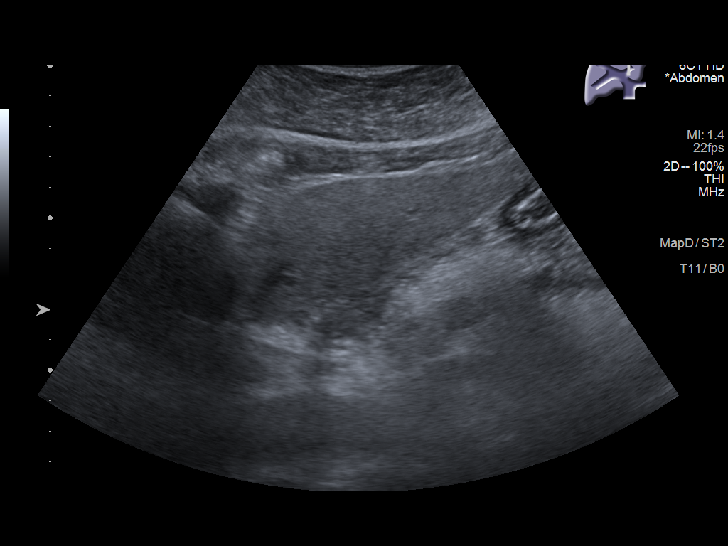
[im 23/37]
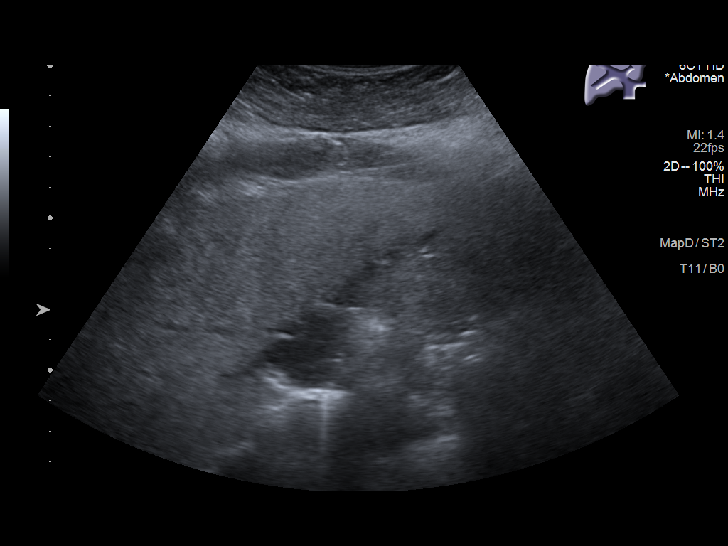
[im 25/37]
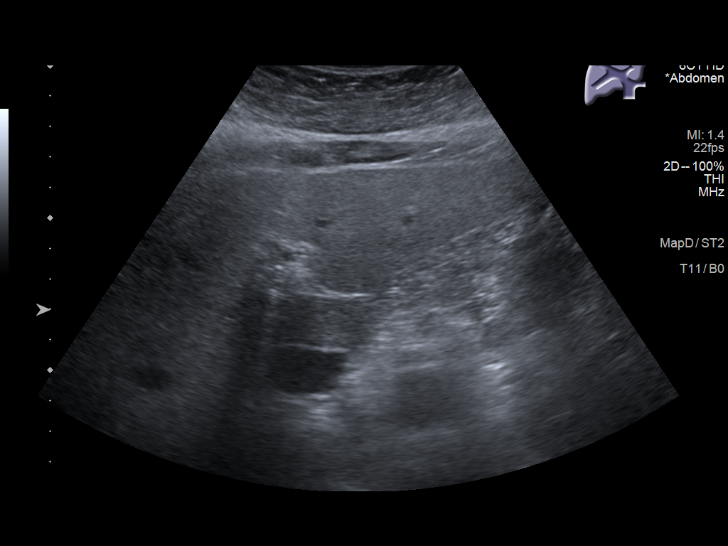
[im 28/37]
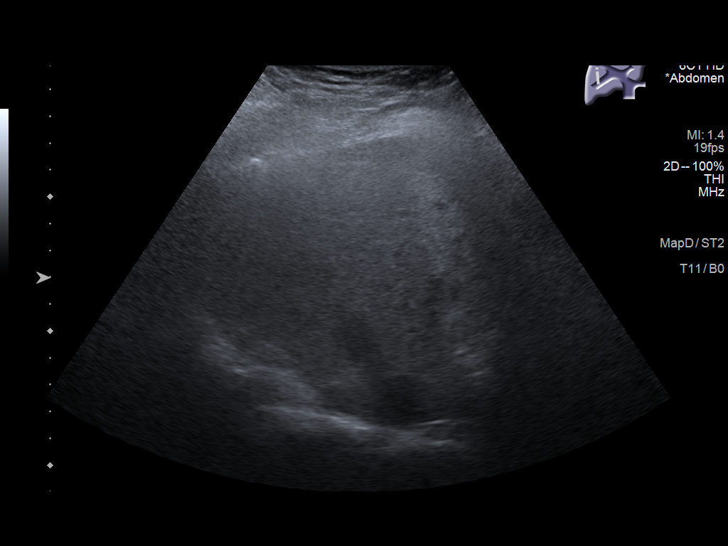
[im 31/37]
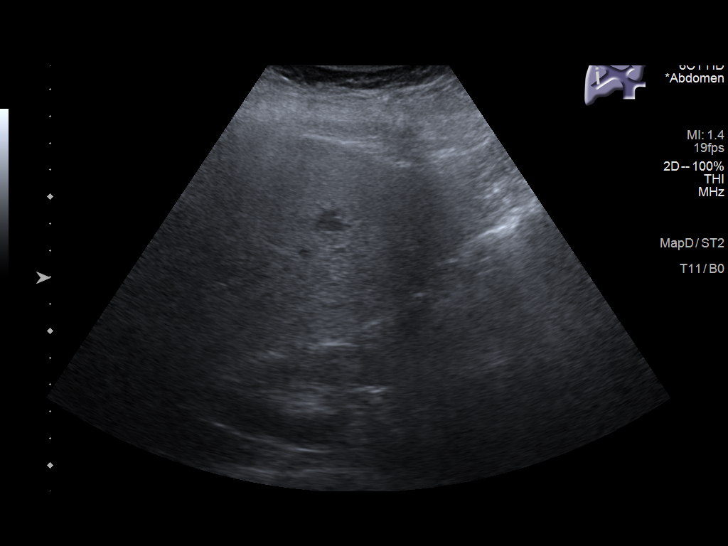
[im 34/37]
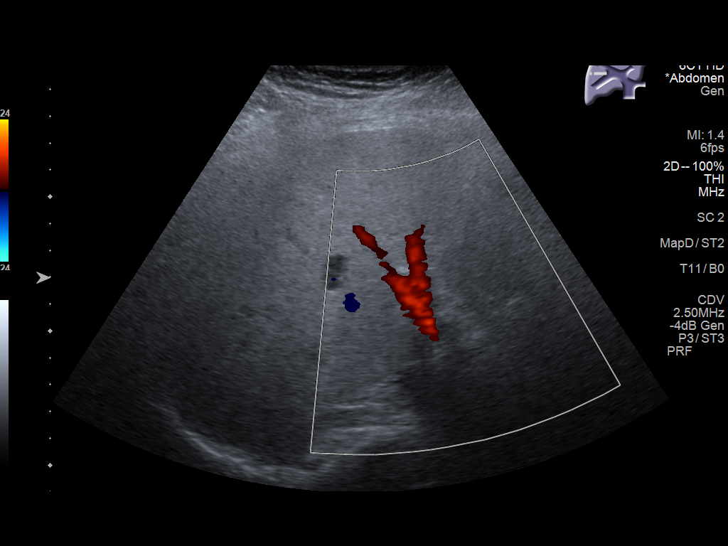
[im 37/37]
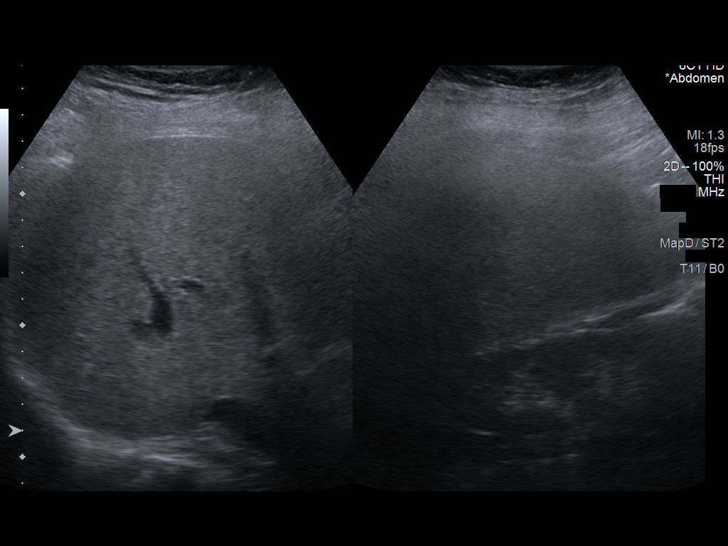

[14 of 25 positions shown; findings below may reference images not displayed]

FINDINGS: Gallbladder:

There are multiple stones within gallbladder. There is no
gallbladder wall thickening or pericholecystic fluid. Negative
sonographic Murphy's sign.

Common bile duct:

Diameter: 3 mm

Liver:

There is diffuse increased liver echogenicity most commonly seen in
the setting of fatty infiltration. Superimposed inflammation or
fibrosis is not excluded. Clinical correlation is recommended.
Portal vein is patent on color Doppler imaging with normal direction
of blood flow towards the liver.

Other: None.
IMPRESSION: 1. Cholelithiasis without sonographic evidence of acute
cholecystitis.
2. Fatty liver.

## 2019-05-15 ENCOUNTER — Other Ambulatory Visit: Payer: Self-pay | Admitting: Family Medicine

## 2019-05-15 DIAGNOSIS — M5416 Radiculopathy, lumbar region: Secondary | ICD-10-CM

## 2019-06-11 ENCOUNTER — Ambulatory Visit
Admission: RE | Admit: 2019-06-11 | Discharge: 2019-06-11 | Disposition: A | Discharge status: Home / Self Care | Payer: Medicare Other | Source: Ambulatory Visit | Attending: Family Medicine | Admitting: Family Medicine

## 2019-06-11 DIAGNOSIS — M5416 Radiculopathy, lumbar region: Secondary | ICD-10-CM

## 2019-06-11 IMAGING — MR MR LUMBAR SPINE W/O CM
4 of 5 series · 22 of 48 positions shown · non-contrast
Comparison: None.

CLINICAL DATA: Chronic low back pain radiating into both legs.

EXAM:
MRI LUMBAR SPINE WITHOUT CONTRAST
TECHNIQUE: Multiplanar, multisequence MR imaging of the lumbar spine was
performed. No intravenous contrast was administered.

[Series 6: T2 · sagittal · 4.0mm · 0.73mm/px · 6 of 13 slices shown (1 of 2)]
[im 1/13]
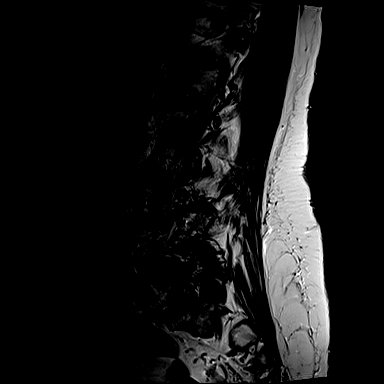
[im 3/13]
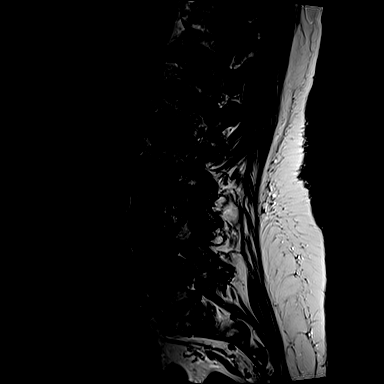
[im 5/13]
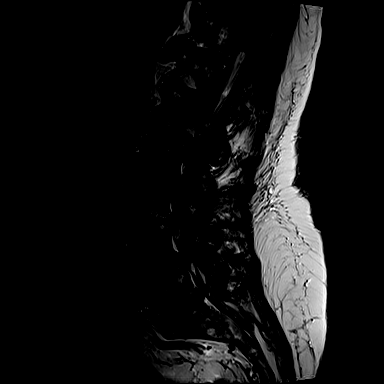
[im 8/13]
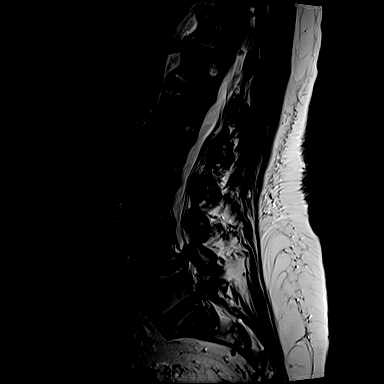
[im 10/13]
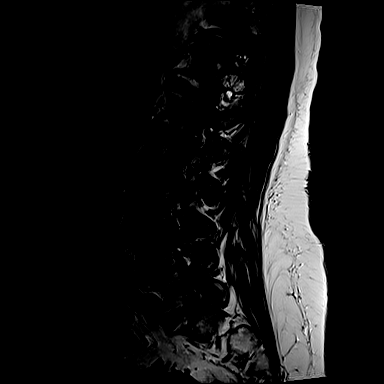
[im 13/13]
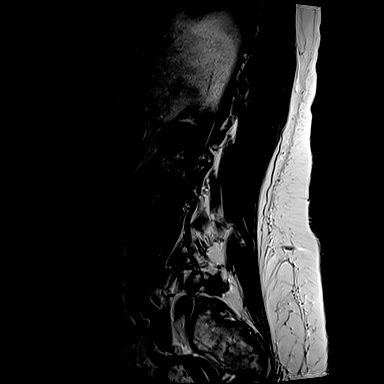

[Series 7: T1 · sagittal · 4.0mm · 0.73mm/px · 3 of 13 slices shown (1 of 2)]
[im 1/13]
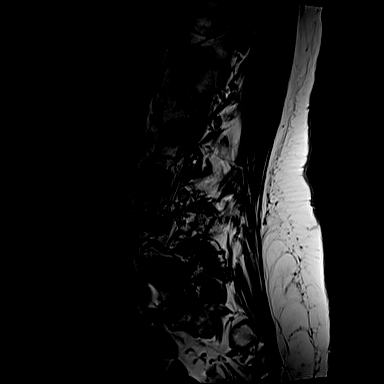
[im 7/13]
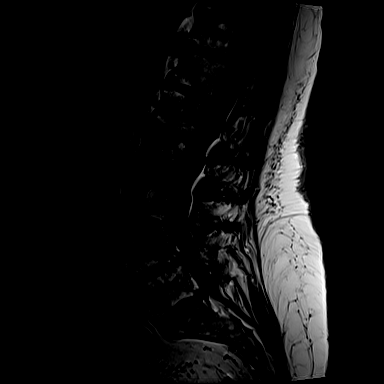
[im 13/13]
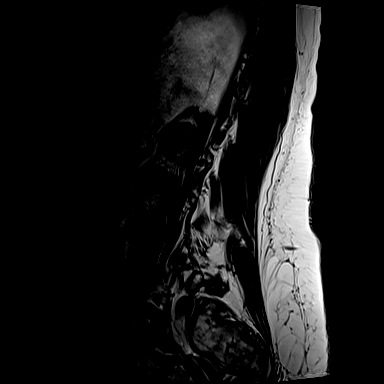

[Series 11: T2 · axial · 4.0mm · 0.35mm/px · z∈[-144,+70]mm · 10 of 38 slices shown (2 of 2)]
[im 3/38]
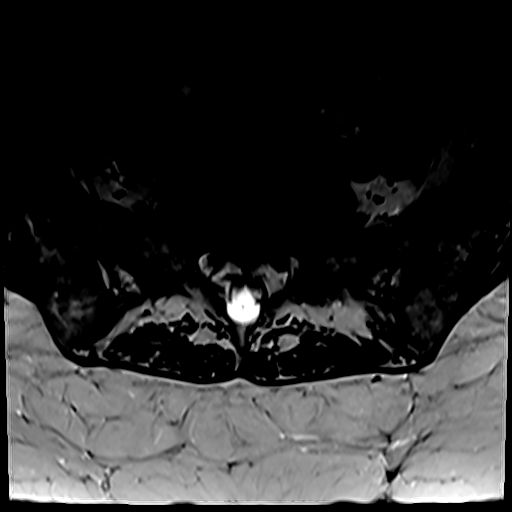
[im 5/38]
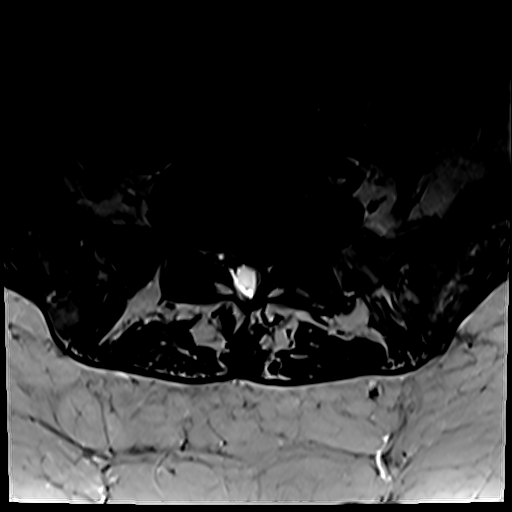
[im 8/38]
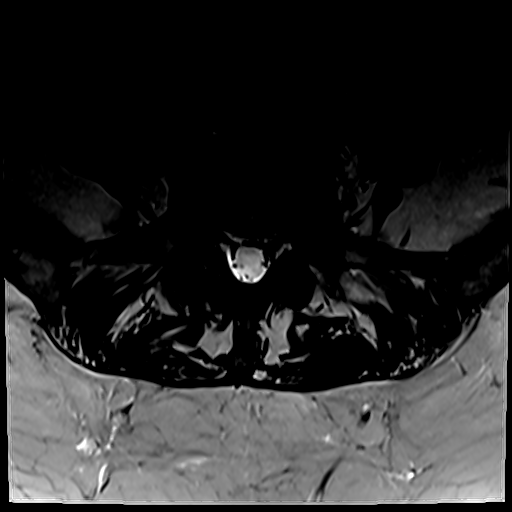
[im 13/38]
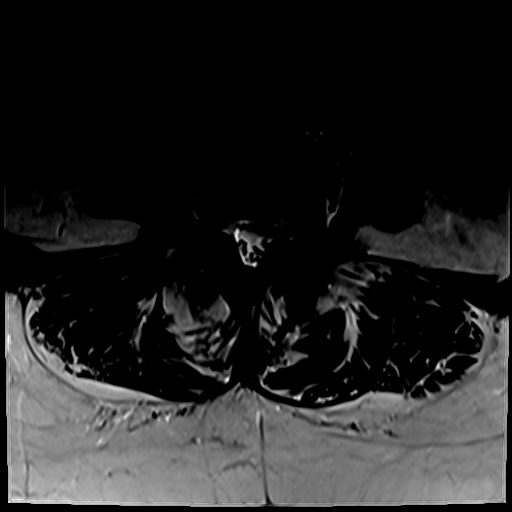
[im 18/38]
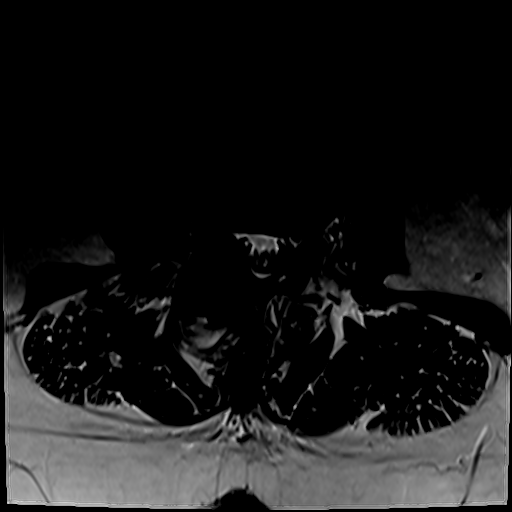
[im 20/38]
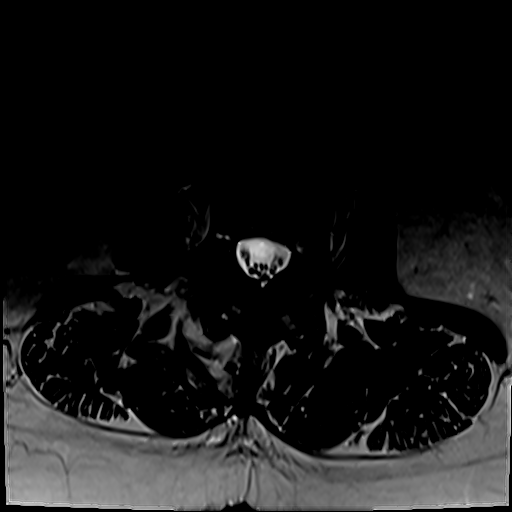
[im 23/38]
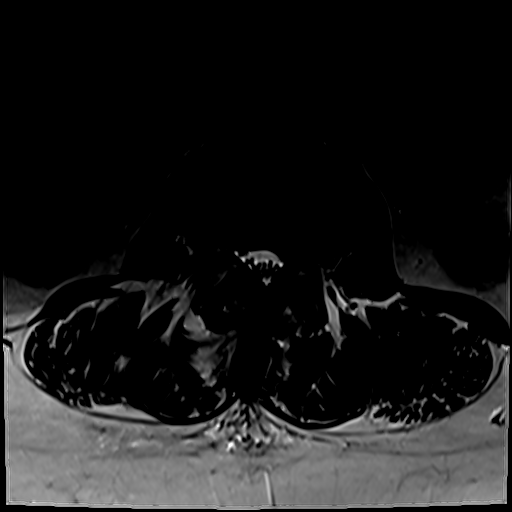
[im 28/38]
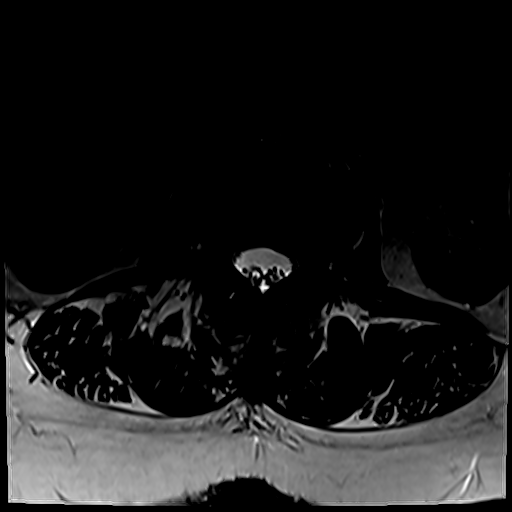
[im 33/38]
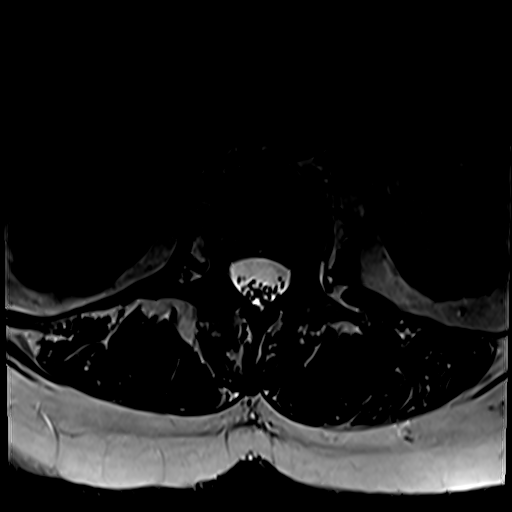
[im 38/38]
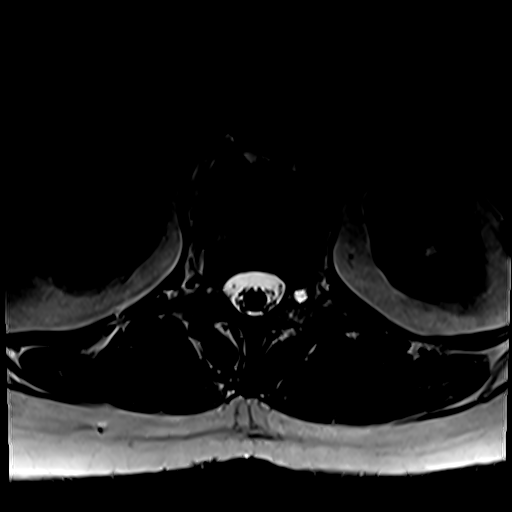

[Series 14: T1 · axial · 4.0mm · 0.35mm/px · z∈[-134,+46]mm · 3 of 38 slices shown (2 of 2)]
[im 5/38]
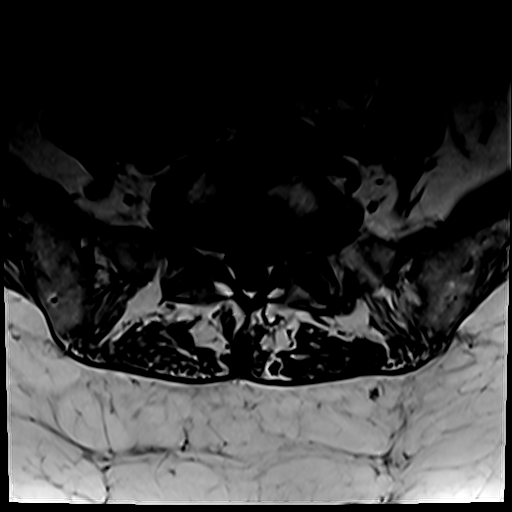
[im 20/38]
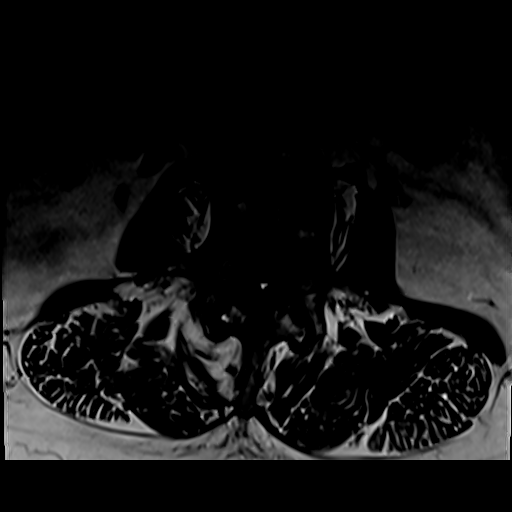
[im 33/38]
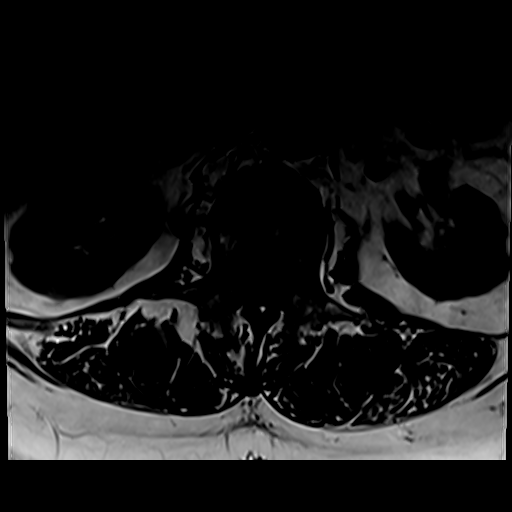

[22 of 48 positions shown; findings below may reference images not displayed]

FINDINGS: Segmentation:  Standard.

Alignment: 0.3 cm anterolisthesis L4 on L5 due to facet arthropathy
is seen. There is convex left scoliosis with the apex at
approximately L3-4.

Vertebrae: No fracture, evidence of discitis, or bone lesion.
Scattered degenerative endplate signal change is seen.

Conus medullaris and cauda equina: Conus extends to the L1 level.
Conus and cauda equina appear normal.

Paraspinal and other soft tissues: Negative.

Disc levels:

T10-11 and T11-12 are imaged in the sagittal plane only and
negative.

T12-L1: Shallow bulge without stenosis.

L1-2: Ligamentum flavum thickening and a shallow right paracentral
protrusion without stenosis.

L2-3: Moderate facet arthropathy and ligamentum flavum thickening.
There is a shallow disc bulge with a superimposed left subarticular
recess protrusion. The patient's protrusion encroaches on the
descending left L3 root. There is mild central canal stenosis
overall. Mild to moderate foraminal narrowing is worse on the right.

L3-4: Moderate to advanced facet arthropathy is worse on the right.
Ligamentum flavum thickening and a shallow disc bulge are present.
There is moderate central canal stenosis with some narrowing in the
subarticular recesses. Mild foraminal narrowing is worse on the
right.

L4-5: Moderate to advanced bilateral facet degenerative change is
worse on the right. Ligamentum flavum thickening and a broad-based
disc bulge are seen. There is severe central canal and bilateral
subarticular recess narrowing. Mild to moderate bilateral foraminal
narrowing is also present.

L5-S1: Broad-based disc bulge results in narrowing in the
subarticular recesses. Mild to moderate foraminal narrowing is worse
on the left.
IMPRESSION: Spondylosis appears worst at L4-5 where there is severe central
canal and bilateral subarticular recess narrowing.

A left subarticular recess protrusion superimposed on a bulge at
L2-3 encroaches on the descending left L3 root. There is mild
central canal stenosis overall at L2-3 and mild to moderate
foraminal narrowing, worse on the right.

Moderate central canal stenosis with some narrowing in the
subarticular recesses at L3-4.

Broad-based disc bulge at L5-S1 results in narrowing in the
subarticular recesses and left worse than right mild to moderate
foraminal narrowing.

## 2019-08-24 DIAGNOSIS — K760 Fatty (change of) liver, not elsewhere classified: Secondary | ICD-10-CM | POA: Insufficient documentation

## 2020-04-19 ENCOUNTER — Other Ambulatory Visit: Payer: Self-pay | Admitting: Physical Medicine and Rehabilitation

## 2020-04-19 DIAGNOSIS — M5416 Radiculopathy, lumbar region: Secondary | ICD-10-CM

## 2020-04-26 ENCOUNTER — Ambulatory Visit: Payer: Medicare Other

## 2020-05-05 ENCOUNTER — Ambulatory Visit
Admission: RE | Admit: 2020-05-05 | Discharge: 2020-05-05 | Disposition: A | Discharge status: Home / Self Care | Payer: Medicare Other | Source: Ambulatory Visit | Attending: Physical Medicine and Rehabilitation | Admitting: Physical Medicine and Rehabilitation

## 2020-05-05 ENCOUNTER — Other Ambulatory Visit: Payer: Self-pay

## 2020-05-05 DIAGNOSIS — M5416 Radiculopathy, lumbar region: Secondary | ICD-10-CM | POA: Diagnosis present

## 2020-05-05 IMAGING — MR MR LUMBAR SPINE W/O CM
5 series · 33 of 48 positions shown · non-contrast
Comparison: [DATE]

CLINICAL DATA: Low back pain radiating down left leg

EXAM:
MRI LUMBAR SPINE WITHOUT CONTRAST
TECHNIQUE: Multiplanar, multisequence MR imaging of the lumbar spine was
performed. No intravenous contrast was administered.

[Series 9: T2 · sagittal · 4.0mm · 1.02mm/px · 6 of 17 slices shown (1 of 2)]
[im 1/17]
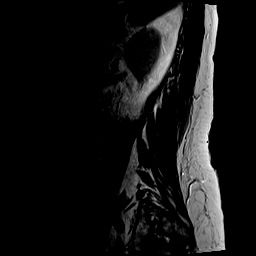
[im 4/17]
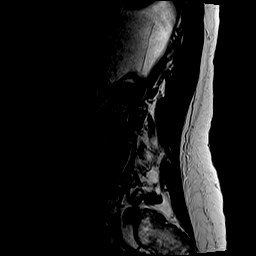
[im 7/17]
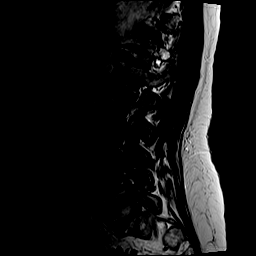
[im 10/17]
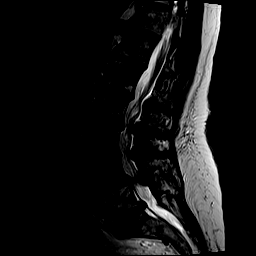
[im 13/17]
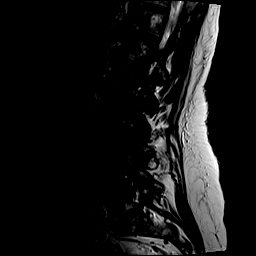
[im 17/17]
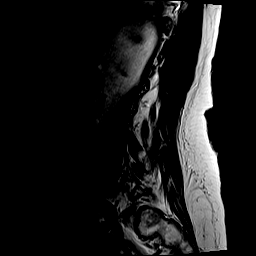

[Series 10: T1 · sagittal · 4.0mm · 1.02mm/px · 7 of 17 slices shown (1 of 2)]
[im 1/17]
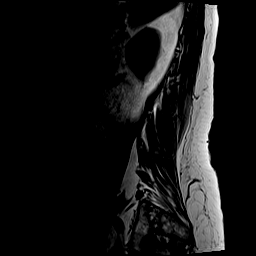
[im 3/17]
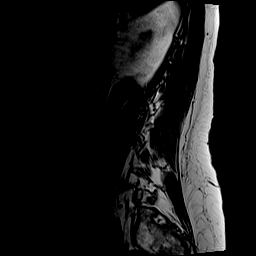
[im 6/17]
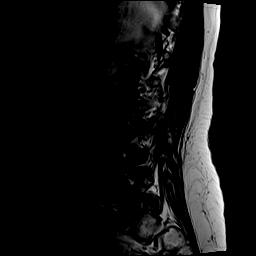
[im 9/17]
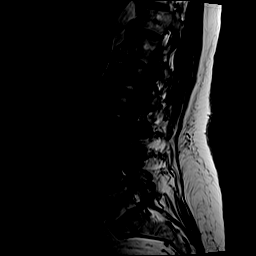
[im 11/17]
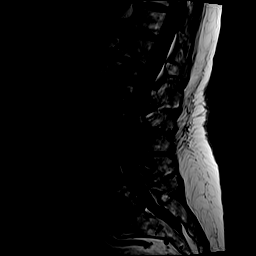
[im 14/17]
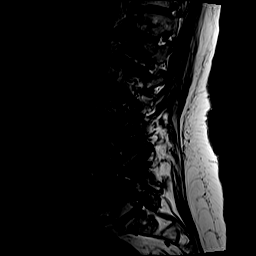
[im 17/17]
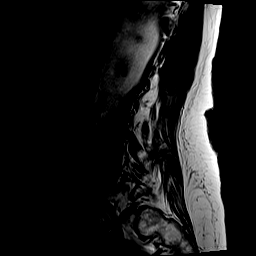

[Series 11: STIR · sagittal · 4.0mm · 0.51mm/px · 4 of 17 slices shown]
[im 1/17]
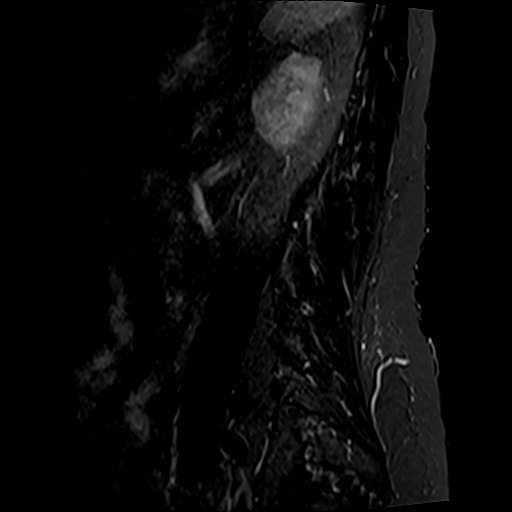
[im 3/17]
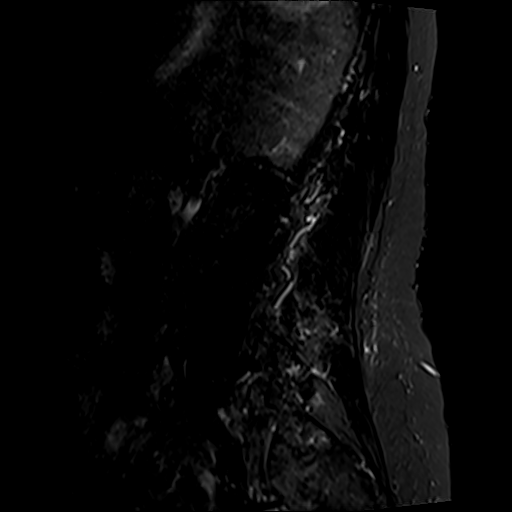
[im 6/17]
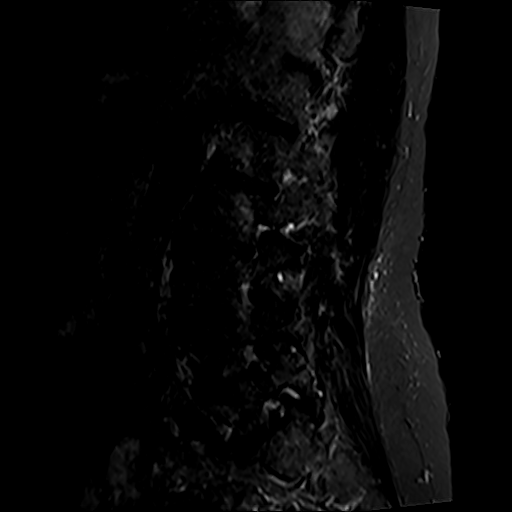
[im 9/17]
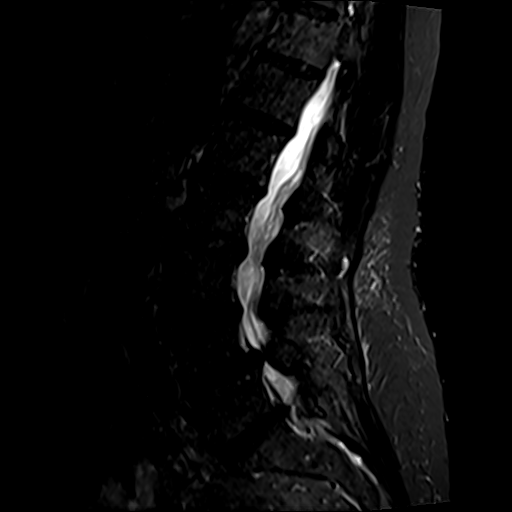

[Series 12: T2 · axial · 4.0mm · 0.78mm/px · z∈[-66,+144]mm · 8 of 34 slices shown (2 of 2)]
[im 1/34]
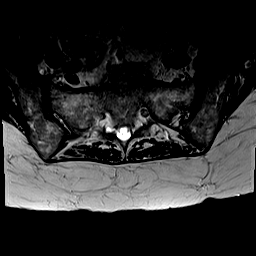
[im 6/34]
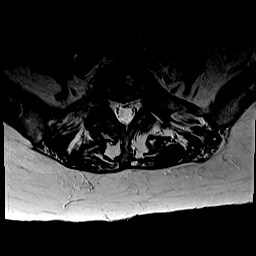
[im 11/34]
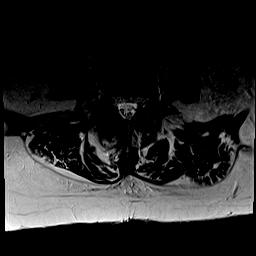
[im 16/34]
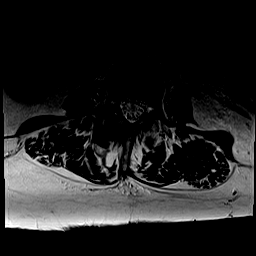
[im 18/34]
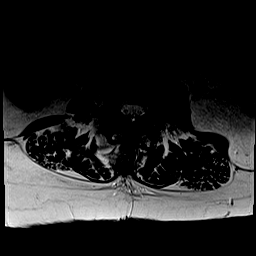
[im 23/34]
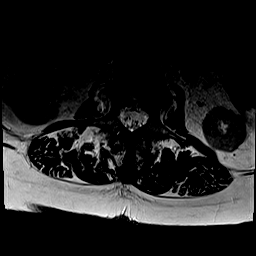
[im 28/34]
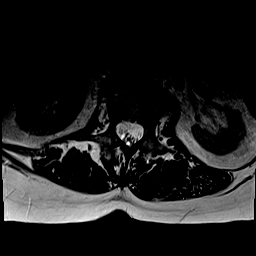
[im 34/34]
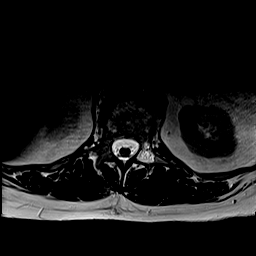

[Series 13: T1 · axial · 4.0mm · 0.39mm/px · z∈[-66,+144]mm · 8 of 34 slices shown (2 of 2)]
[im 1/34]
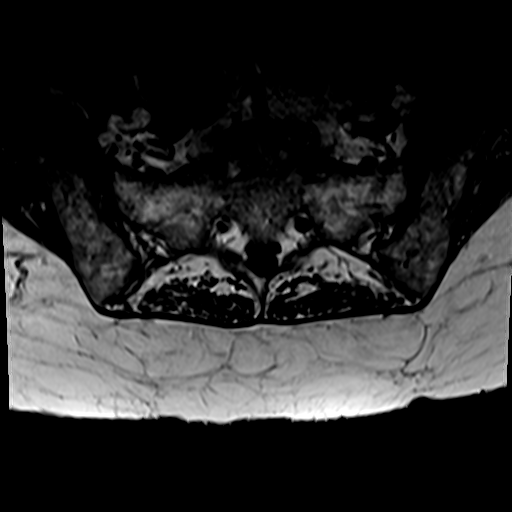
[im 6/34]
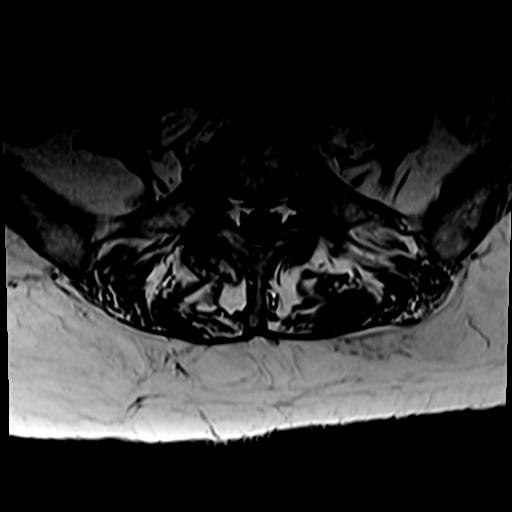
[im 11/34]
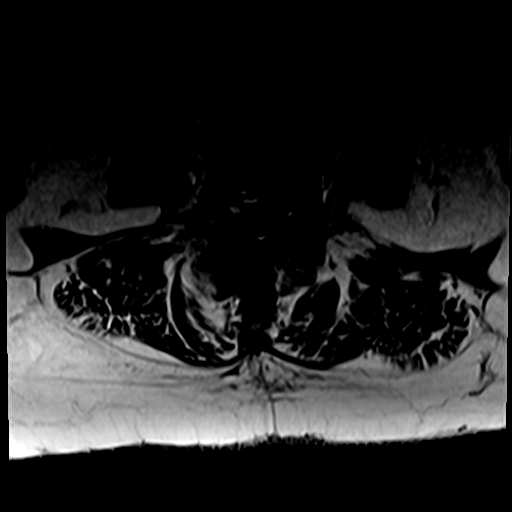
[im 16/34]
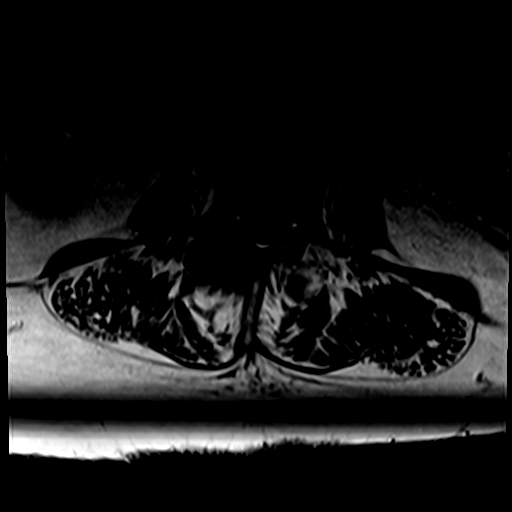
[im 18/34]
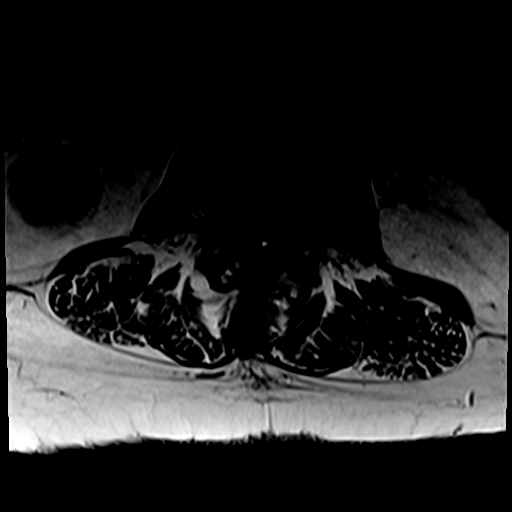
[im 23/34]
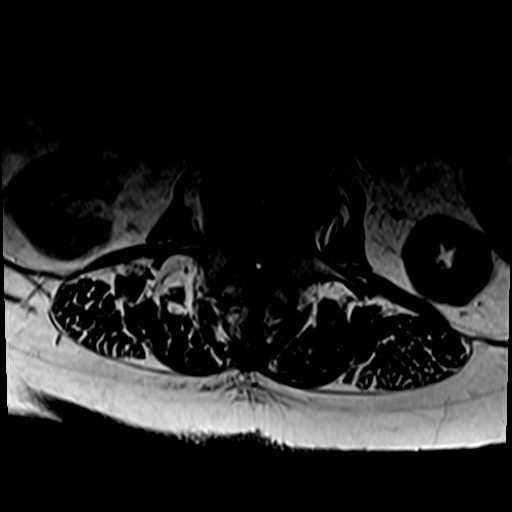
[im 28/34]
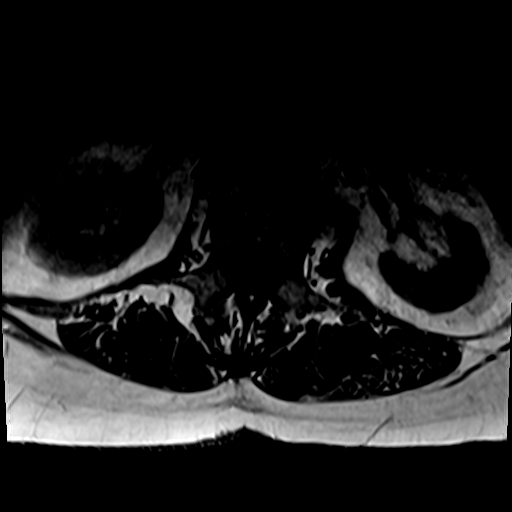
[im 34/34]
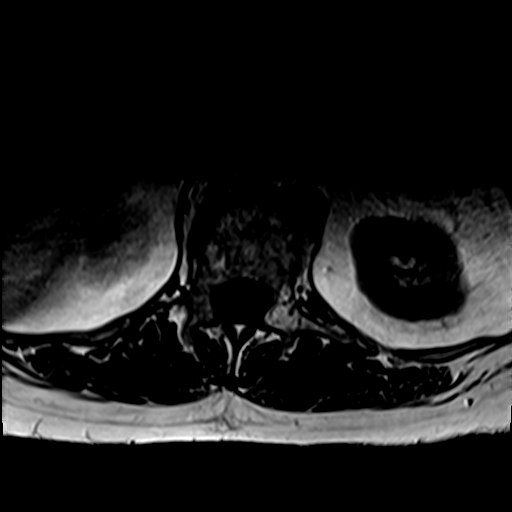

[33 of 48 positions shown; findings below may reference images not displayed]

FINDINGS: Segmentation:  Standard.

Alignment:  Stable with levocurvature.

Vertebrae: Stable vertebral body heights with diffuse degenerative
endplate irregularity. Marrow signal is mildly heterogeneous similar
to prior without substantial edema or suspicious osseous lesion.
There is a hemangioma involving the left posterior elements of T12.

Conus medullaris and cauda equina: Conus extends to the L1 level.
Conus and cauda equina appear normal.

Paraspinal and other soft tissues: Unremarkable.

Disc levels:

T12-L1: Minimal disc bulge. No canal or foraminal stenosis.
Appearance is similar.

L1-L2: Disc bulge slightly eccentric to the right. Mild facet
arthropathy with ligamentum flavum infolding. No canal stenosis.
Mild right foraminal stenosis. No left foraminal stenosis.
Appearance is similar.

L2-L3: Disc bulge with superimposed left subarticular protrusion.
Moderate facet arthropathy with ligamentum flavum infolding.
Increased moderate canal stenosis. Increased narrowing of the left
greater than right subarticular recesses. Moderate right and mild
left foraminal stenosis are similar.

L3-L4: Disc bulge. Marked right and moderate left facet arthropathy
with ligamentum flavum infolding. Moderate canal stenosis. Partial
effacement of the subarticular recesses. Mild to moderate right
foraminal stenosis. Minor left foraminal stenosis. Appearance is
similar.

L4-L5: Disc bulge with superimposed central disc protrusion.
Moderate to marked right and moderate left facet arthropathy with
ligamentum flavum infolding. Marked canal stenosis with effacement
of subarticular recesses. Mild to moderate foraminal stenosis.
Appearance is similar.

L5-S1: Disc bulge with endplate osteophytic ridging. Moderate facet
arthropathy with ligamentum flavum infolding. Minor canal stenosis.
Partial effacement of the subarticular recesses. Mild to moderate
right and moderate left foraminal stenosis. Appearance is similar.
IMPRESSION: Advanced multilevel degenerative changes as detailed above overall
similar in appearance to the prior study. As before, there is marked
canal stenosis at L4-L5 with narrowing of the subarticular recesses.
There is increased moderate canal stenosis at L2-L3 with increased
subarticular recess narrowing.

## 2020-06-27 ENCOUNTER — Other Ambulatory Visit: Payer: Self-pay | Admitting: Gastroenterology

## 2020-06-27 DIAGNOSIS — K8689 Other specified diseases of pancreas: Secondary | ICD-10-CM

## 2020-06-27 DIAGNOSIS — R197 Diarrhea, unspecified: Secondary | ICD-10-CM

## 2020-07-14 ENCOUNTER — Other Ambulatory Visit: Payer: Self-pay

## 2020-07-14 ENCOUNTER — Ambulatory Visit
Admission: RE | Admit: 2020-07-14 | Discharge: 2020-07-14 | Disposition: A | Discharge status: Home / Self Care | Payer: Medicare Other | Source: Ambulatory Visit | Attending: Gastroenterology | Admitting: Gastroenterology

## 2020-07-14 DIAGNOSIS — R197 Diarrhea, unspecified: Secondary | ICD-10-CM | POA: Diagnosis present

## 2020-07-14 DIAGNOSIS — K8689 Other specified diseases of pancreas: Secondary | ICD-10-CM | POA: Insufficient documentation

## 2020-07-14 HISTORY — DX: Essential (primary) hypertension: I10

## 2020-07-14 LAB — POCT I-STAT CREATININE: Creatinine, Ser: 0.9 mg/dL (ref 0.44–1.00)

## 2020-07-14 IMAGING — CT CT ABD-PELV W/ CM
2 of 5 series · 15 of 46 positions shown, 17 images · IV contrast (omnipaque)
Comparison: Abdominal ultrasound [DATE]

CLINICAL DATA: Epigastric pain. Mild satiety and abdominal
bloating.

EXAM:
CT ABDOMEN AND PELVIS WITH CONTRAST
TECHNIQUE: Multidetector CT imaging of the abdomen and pelvis was performed
using the standard protocol following bolus administration of
intravenous contrast.
CONTRAST:  100mL OMNIPAQUE IOHEXOL 300 MG/ML  SOLN

[Series 2: abd pelvis 5.00 · axial · 0.72mm/px · z∈[-1527,-1112]mm · 12 of 95 slices shown, 14 images]
[im 6/95  soft-tissue]
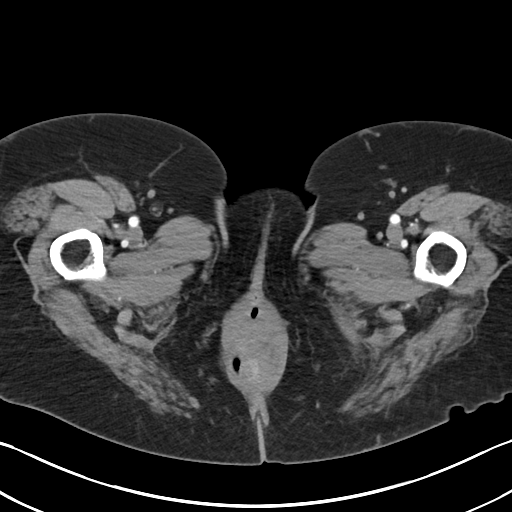
[im 6/95  bone]
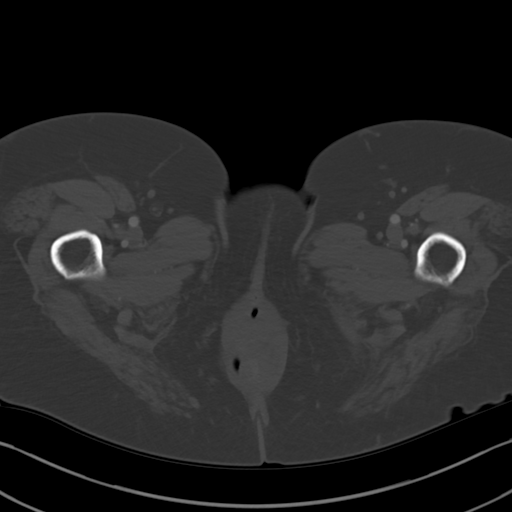
[im 16/95  soft-tissue]
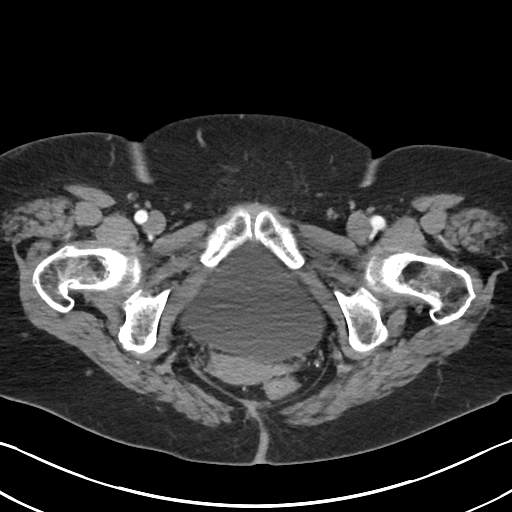
[im 21/95  soft-tissue]
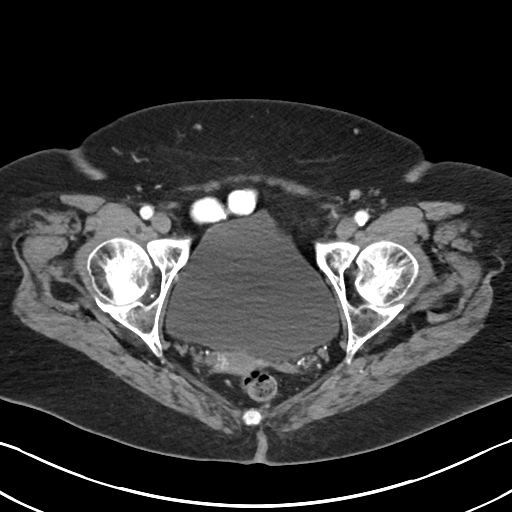
[im 27/95  soft-tissue]
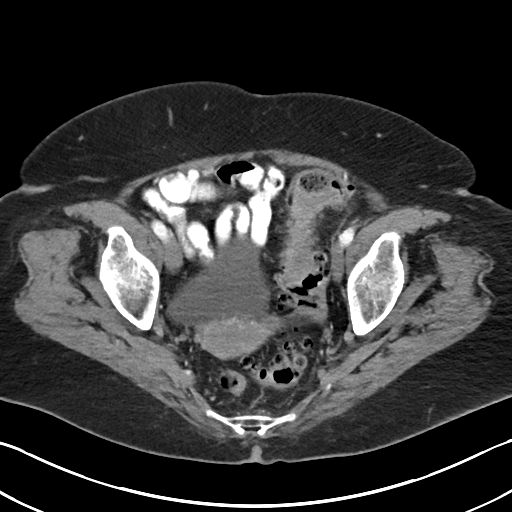
[im 37/95  soft-tissue]
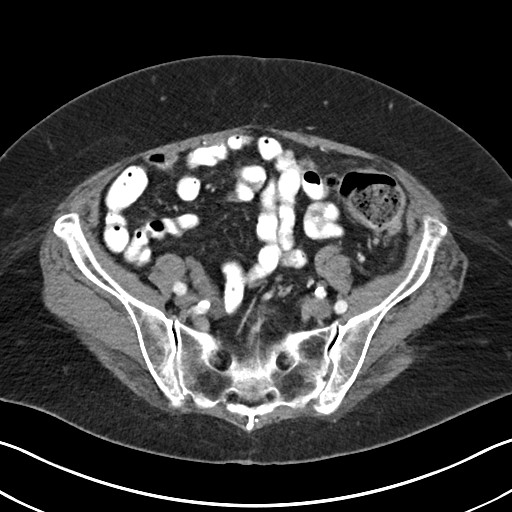
[im 42/95  soft-tissue]
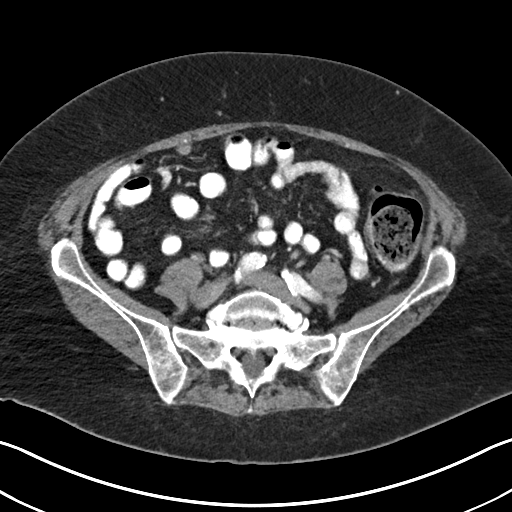
[im 53/95  soft-tissue]
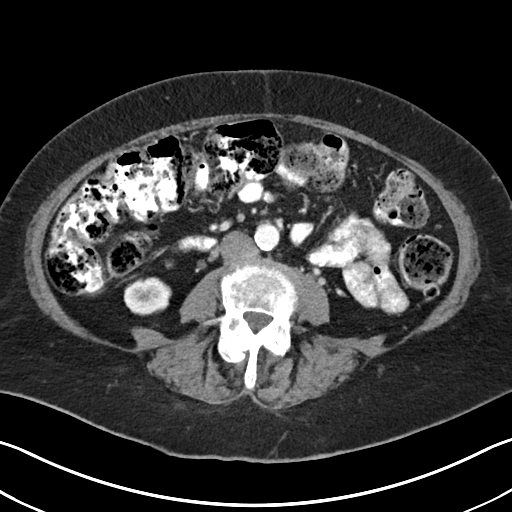
[im 58/95  soft-tissue]
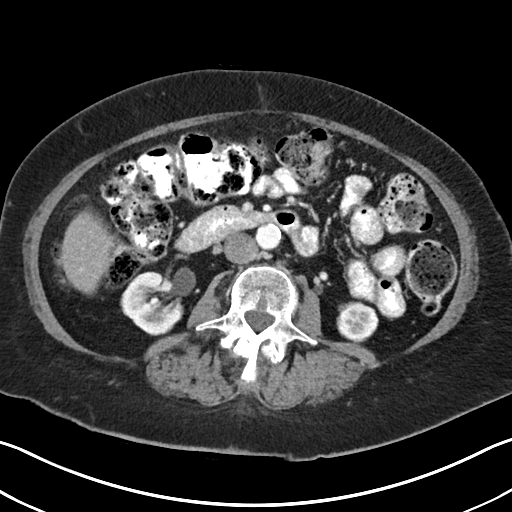
[im 68/95  soft-tissue]
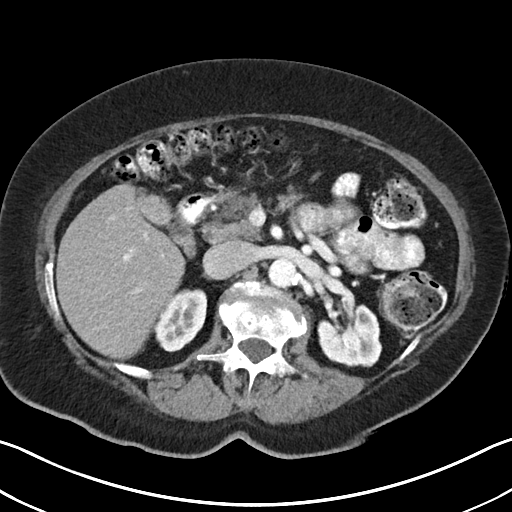
[im 68/95  bone]
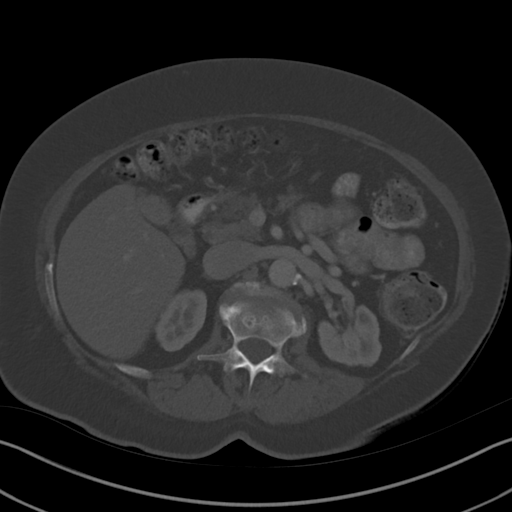
[im 74/95  soft-tissue]
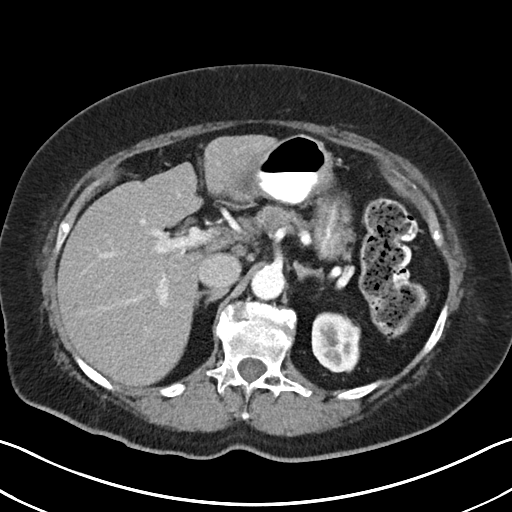
[im 79/95  soft-tissue]
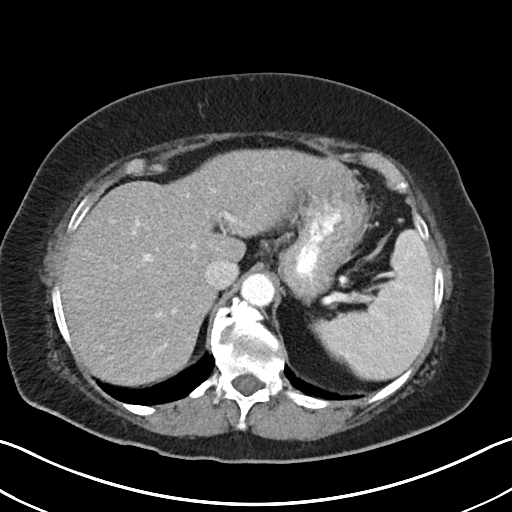
[im 89/95  soft-tissue]
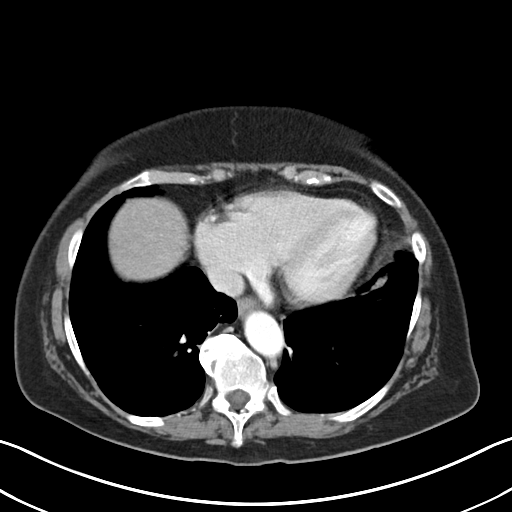

[Series 4: coronals abd pelvis 2.00 cor · coronal · 0.72mm/px · 3 of 152 slices shown]
[im 51/152  soft-tissue]
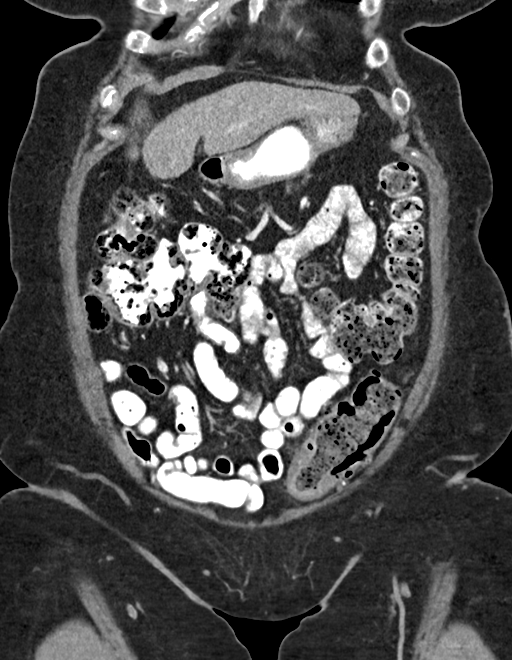
[im 68/152  soft-tissue]
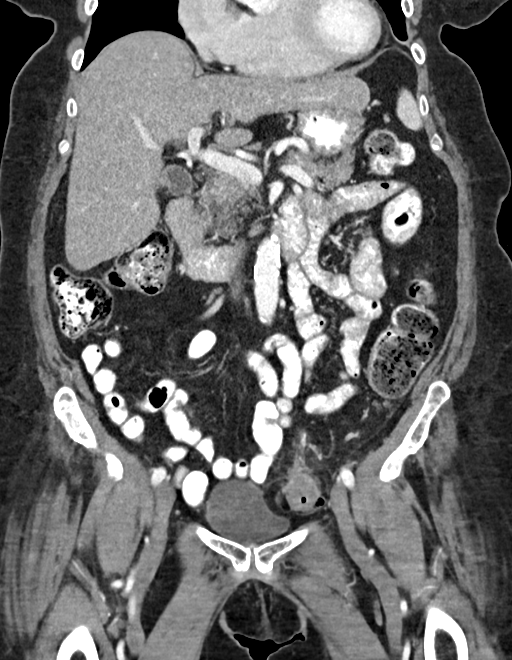
[im 84/152  soft-tissue]
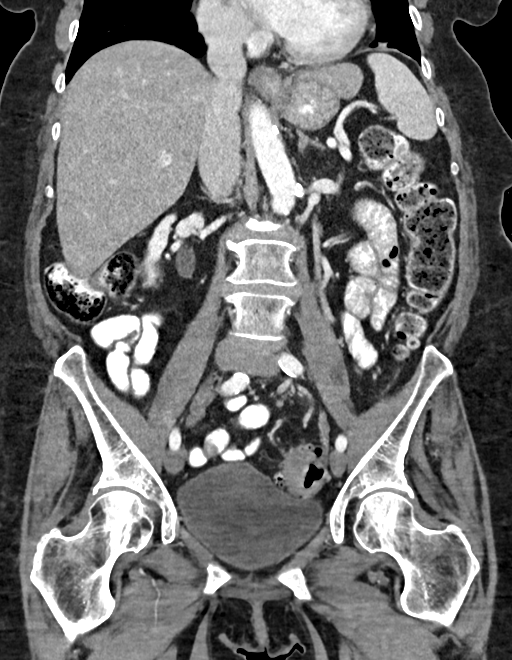

[15 of 46 positions shown; findings below may reference images not displayed]

FINDINGS: Lower chest: Borderline cardiomegaly. There are coronary artery
calcifications. Minor atelectasis in the medial lower lobes. No
confluent consolidation or pleural effusion.

Hepatobiliary: Diffusely decreased hepatic density typical of
steatosis. No focal hepatic lesion. Gallstones without gallbladder
wall thickening or pericholecystic fat stranding. There is no
biliary dilatation.

Pancreas: No ductal dilatation or inflammation. No evidence of
pancreatic mass. There is mild fatty atrophy of the proximal
pancreas.

Spleen: Normal in size without focal abnormality.

Adrenals/Urinary Tract: Normal adrenal glands. No hydronephrosis.
Extrarenal pelvis configuration of the right kidney. Mild cortical
scarring in the lower left kidney. No renal calculi or focal lesion.
Homogeneous enhancement with symmetric excretion on delayed phase
imaging. Distended urinary bladder. Pelvic floor descent with
cystocele. No bladder wall thickening or focal abnormality.

Stomach/Bowel: Tiny hiatal hernia. The stomach appears normal
without gastric wall thickening. Normal small bowel without
obstruction, wall thickening, or inflammation. Terminal ileum is
normal. Floppy cecum located in the right mid abdomen. Normal
appendix courses anteriorly just to the right of midline. Moderate
volume of colonic stool. Diverticulosis seen from the descending
through the sigmoid colon. Sigmoid diverticular changes are
prominent where there is mild associated mural hypertrophy. No Peri
diverticular inflammation or evidence of acute diverticulitis.
Pelvic floor descent with rectocele.

Vascular/Lymphatic: Mild aortic atherosclerosis no aortic aneurysm.
Patent portal vein. No acute vascular findings. No enlarged lymph
nodes in the abdomen or pelvis.

Reproductive: Normal for age uterine atrophy.  No adnexal mass.

Other: No ascites.  Small fat containing umbilical hernia.

Musculoskeletal: Multilevel degenerative change throughout the spine
with vacuum phenomenon. Multilevel facet hypertrophy. There are no
acute or suspicious osseous abnormalities.
IMPRESSION: 1. No acute abnormality in the abdomen/pelvis.
2. Pelvic floor descent with cystocele and rectocele.
3. Colonic diverticulosis without acute inflammation. Diverticular
changes are most prominent involving the sigmoid colon where there
is mural hypertrophy, but no evidence of acute diverticulitis.
4. Hepatic steatosis.
5. Cholelithiasis without gallbladder inflammation.

Aortic Atherosclerosis ([M9]-[M9]).

## 2020-07-14 MED ORDER — IOHEXOL 300 MG/ML  SOLN
100.0000 mL | Freq: Once | INTRAMUSCULAR | Status: AC | PRN
  Administered 2020-07-14: 100 mL via INTRAVENOUS

## 2020-12-05 ENCOUNTER — Ambulatory Visit: Admit: 2020-12-05 | Payer: Medicare Other

## 2020-12-05 SURGERY — COLONOSCOPY WITH PROPOFOL
Anesthesia: General

## 2021-06-12 DIAGNOSIS — K5732 Diverticulitis of large intestine without perforation or abscess without bleeding: Secondary | ICD-10-CM | POA: Diagnosis not present

## 2021-06-12 DIAGNOSIS — Z20822 Contact with and (suspected) exposure to covid-19: Secondary | ICD-10-CM | POA: Diagnosis not present

## 2021-06-12 DIAGNOSIS — I452 Bifascicular block: Secondary | ICD-10-CM | POA: Diagnosis not present

## 2021-06-12 DIAGNOSIS — I498 Other specified cardiac arrhythmias: Secondary | ICD-10-CM | POA: Diagnosis not present

## 2021-06-12 DIAGNOSIS — R1011 Right upper quadrant pain: Secondary | ICD-10-CM | POA: Diagnosis not present

## 2021-06-12 DIAGNOSIS — K529 Noninfective gastroenteritis and colitis, unspecified: Secondary | ICD-10-CM | POA: Diagnosis not present

## 2021-06-12 DIAGNOSIS — K8020 Calculus of gallbladder without cholecystitis without obstruction: Secondary | ICD-10-CM | POA: Diagnosis not present

## 2021-06-12 DIAGNOSIS — K5792 Diverticulitis of intestine, part unspecified, without perforation or abscess without bleeding: Secondary | ICD-10-CM | POA: Diagnosis not present

## 2021-06-12 DIAGNOSIS — R112 Nausea with vomiting, unspecified: Secondary | ICD-10-CM | POA: Diagnosis not present

## 2021-06-12 DIAGNOSIS — R9431 Abnormal electrocardiogram [ECG] [EKG]: Secondary | ICD-10-CM | POA: Diagnosis not present

## 2021-06-12 DIAGNOSIS — R109 Unspecified abdominal pain: Secondary | ICD-10-CM | POA: Diagnosis not present

## 2021-06-12 DIAGNOSIS — I10 Essential (primary) hypertension: Secondary | ICD-10-CM | POA: Diagnosis not present

## 2021-06-12 DIAGNOSIS — M1991 Primary osteoarthritis, unspecified site: Secondary | ICD-10-CM | POA: Diagnosis not present

## 2021-06-12 DIAGNOSIS — R1032 Left lower quadrant pain: Secondary | ICD-10-CM | POA: Diagnosis not present

## 2021-06-13 DIAGNOSIS — K529 Noninfective gastroenteritis and colitis, unspecified: Secondary | ICD-10-CM | POA: Diagnosis not present

## 2021-06-13 DIAGNOSIS — K8020 Calculus of gallbladder without cholecystitis without obstruction: Secondary | ICD-10-CM | POA: Diagnosis not present

## 2021-06-13 DIAGNOSIS — R112 Nausea with vomiting, unspecified: Secondary | ICD-10-CM | POA: Diagnosis not present

## 2021-06-13 DIAGNOSIS — R109 Unspecified abdominal pain: Secondary | ICD-10-CM | POA: Diagnosis not present

## 2021-06-14 DIAGNOSIS — R109 Unspecified abdominal pain: Secondary | ICD-10-CM | POA: Diagnosis not present

## 2021-06-15 DIAGNOSIS — R109 Unspecified abdominal pain: Secondary | ICD-10-CM | POA: Diagnosis not present

## 2021-06-15 DIAGNOSIS — F32A Depression, unspecified: Secondary | ICD-10-CM | POA: Insufficient documentation

## 2021-06-15 DIAGNOSIS — K529 Noninfective gastroenteritis and colitis, unspecified: Secondary | ICD-10-CM | POA: Insufficient documentation

## 2021-06-15 DIAGNOSIS — Z79899 Other long term (current) drug therapy: Secondary | ICD-10-CM | POA: Diagnosis not present

## 2021-06-15 DIAGNOSIS — M1990 Unspecified osteoarthritis, unspecified site: Secondary | ICD-10-CM | POA: Diagnosis not present

## 2021-06-15 DIAGNOSIS — K5939 Other megacolon: Secondary | ICD-10-CM | POA: Diagnosis not present

## 2021-06-15 DIAGNOSIS — B9689 Other specified bacterial agents as the cause of diseases classified elsewhere: Secondary | ICD-10-CM | POA: Diagnosis not present

## 2021-06-15 DIAGNOSIS — K5190 Ulcerative colitis, unspecified, without complications: Secondary | ICD-10-CM | POA: Diagnosis not present

## 2021-06-15 DIAGNOSIS — R1032 Left lower quadrant pain: Secondary | ICD-10-CM | POA: Diagnosis not present

## 2021-06-15 DIAGNOSIS — K56609 Unspecified intestinal obstruction, unspecified as to partial versus complete obstruction: Secondary | ICD-10-CM | POA: Diagnosis not present

## 2021-06-15 DIAGNOSIS — R933 Abnormal findings on diagnostic imaging of other parts of digestive tract: Secondary | ICD-10-CM | POA: Diagnosis not present

## 2021-06-15 DIAGNOSIS — B966 Bacteroides fragilis [B. fragilis] as the cause of diseases classified elsewhere: Secondary | ICD-10-CM | POA: Diagnosis not present

## 2021-06-15 DIAGNOSIS — G8929 Other chronic pain: Secondary | ICD-10-CM | POA: Diagnosis not present

## 2021-06-15 DIAGNOSIS — K219 Gastro-esophageal reflux disease without esophagitis: Secondary | ICD-10-CM | POA: Diagnosis not present

## 2021-06-15 DIAGNOSIS — I452 Bifascicular block: Secondary | ICD-10-CM | POA: Diagnosis not present

## 2021-06-15 DIAGNOSIS — K56699 Other intestinal obstruction unspecified as to partial versus complete obstruction: Secondary | ICD-10-CM | POA: Diagnosis not present

## 2021-06-15 DIAGNOSIS — R7881 Bacteremia: Secondary | ICD-10-CM | POA: Insufficient documentation

## 2021-06-15 DIAGNOSIS — I10 Essential (primary) hypertension: Secondary | ICD-10-CM | POA: Insufficient documentation

## 2021-06-15 DIAGNOSIS — R1033 Periumbilical pain: Secondary | ICD-10-CM | POA: Diagnosis not present

## 2021-06-15 DIAGNOSIS — E876 Hypokalemia: Secondary | ICD-10-CM | POA: Diagnosis not present

## 2021-06-15 DIAGNOSIS — J309 Allergic rhinitis, unspecified: Secondary | ICD-10-CM | POA: Diagnosis not present

## 2021-06-15 DIAGNOSIS — M549 Dorsalgia, unspecified: Secondary | ICD-10-CM | POA: Diagnosis not present

## 2021-06-15 DIAGNOSIS — Z20822 Contact with and (suspected) exposure to covid-19: Secondary | ICD-10-CM | POA: Diagnosis not present

## 2021-06-16 DIAGNOSIS — A498 Other bacterial infections of unspecified site: Secondary | ICD-10-CM | POA: Insufficient documentation

## 2021-06-16 DIAGNOSIS — R109 Unspecified abdominal pain: Secondary | ICD-10-CM | POA: Diagnosis not present

## 2021-06-16 DIAGNOSIS — R933 Abnormal findings on diagnostic imaging of other parts of digestive tract: Secondary | ICD-10-CM | POA: Diagnosis not present

## 2021-06-16 DIAGNOSIS — I10 Essential (primary) hypertension: Secondary | ICD-10-CM | POA: Diagnosis not present

## 2021-06-16 DIAGNOSIS — B966 Bacteroides fragilis [B. fragilis] as the cause of diseases classified elsewhere: Secondary | ICD-10-CM | POA: Diagnosis not present

## 2021-06-16 DIAGNOSIS — R7881 Bacteremia: Secondary | ICD-10-CM | POA: Diagnosis not present

## 2021-06-17 DIAGNOSIS — K56609 Unspecified intestinal obstruction, unspecified as to partial versus complete obstruction: Secondary | ICD-10-CM | POA: Diagnosis not present

## 2021-06-17 DIAGNOSIS — K56699 Other intestinal obstruction unspecified as to partial versus complete obstruction: Secondary | ICD-10-CM | POA: Diagnosis not present

## 2021-06-18 DIAGNOSIS — K56609 Unspecified intestinal obstruction, unspecified as to partial versus complete obstruction: Secondary | ICD-10-CM | POA: Insufficient documentation

## 2021-06-22 DIAGNOSIS — I10 Essential (primary) hypertension: Secondary | ICD-10-CM | POA: Diagnosis not present

## 2021-06-22 DIAGNOSIS — K56609 Unspecified intestinal obstruction, unspecified as to partial versus complete obstruction: Secondary | ICD-10-CM | POA: Diagnosis not present

## 2021-06-22 DIAGNOSIS — J309 Allergic rhinitis, unspecified: Secondary | ICD-10-CM | POA: Diagnosis not present

## 2021-06-22 DIAGNOSIS — K529 Noninfective gastroenteritis and colitis, unspecified: Secondary | ICD-10-CM | POA: Diagnosis not present

## 2021-06-22 DIAGNOSIS — R7881 Bacteremia: Secondary | ICD-10-CM | POA: Diagnosis not present

## 2021-06-28 DIAGNOSIS — Z933 Colostomy status: Secondary | ICD-10-CM | POA: Diagnosis not present

## 2021-07-06 DIAGNOSIS — N390 Urinary tract infection, site not specified: Secondary | ICD-10-CM | POA: Diagnosis not present

## 2021-07-10 DIAGNOSIS — Z1211 Encounter for screening for malignant neoplasm of colon: Secondary | ICD-10-CM | POA: Diagnosis not present

## 2021-07-10 DIAGNOSIS — Z933 Colostomy status: Secondary | ICD-10-CM | POA: Diagnosis not present

## 2021-07-17 DIAGNOSIS — K56699 Other intestinal obstruction unspecified as to partial versus complete obstruction: Secondary | ICD-10-CM | POA: Diagnosis not present

## 2021-07-17 DIAGNOSIS — I10 Essential (primary) hypertension: Secondary | ICD-10-CM | POA: Diagnosis not present

## 2021-07-17 DIAGNOSIS — K6389 Other specified diseases of intestine: Secondary | ICD-10-CM | POA: Diagnosis not present

## 2021-07-17 DIAGNOSIS — D128 Benign neoplasm of rectum: Secondary | ICD-10-CM | POA: Diagnosis not present

## 2021-07-17 DIAGNOSIS — Z79899 Other long term (current) drug therapy: Secondary | ICD-10-CM | POA: Diagnosis not present

## 2021-07-17 DIAGNOSIS — D369 Benign neoplasm, unspecified site: Secondary | ICD-10-CM | POA: Diagnosis not present

## 2021-07-17 DIAGNOSIS — K5730 Diverticulosis of large intestine without perforation or abscess without bleeding: Secondary | ICD-10-CM | POA: Diagnosis not present

## 2021-07-17 DIAGNOSIS — Z980 Intestinal bypass and anastomosis status: Secondary | ICD-10-CM | POA: Diagnosis not present

## 2021-07-17 DIAGNOSIS — Z1211 Encounter for screening for malignant neoplasm of colon: Secondary | ICD-10-CM | POA: Diagnosis not present

## 2021-07-17 DIAGNOSIS — K621 Rectal polyp: Secondary | ICD-10-CM | POA: Diagnosis not present

## 2021-07-31 DIAGNOSIS — I10 Essential (primary) hypertension: Secondary | ICD-10-CM | POA: Diagnosis not present

## 2021-07-31 DIAGNOSIS — Z933 Colostomy status: Secondary | ICD-10-CM | POA: Insufficient documentation

## 2021-08-09 DIAGNOSIS — I10 Essential (primary) hypertension: Secondary | ICD-10-CM | POA: Diagnosis not present

## 2021-08-09 DIAGNOSIS — K8020 Calculus of gallbladder without cholecystitis without obstruction: Secondary | ICD-10-CM | POA: Diagnosis not present

## 2021-08-09 DIAGNOSIS — K56609 Unspecified intestinal obstruction, unspecified as to partial versus complete obstruction: Secondary | ICD-10-CM | POA: Diagnosis not present

## 2021-08-09 DIAGNOSIS — B957 Other staphylococcus as the cause of diseases classified elsewhere: Secondary | ICD-10-CM | POA: Diagnosis not present

## 2021-08-09 DIAGNOSIS — K660 Peritoneal adhesions (postprocedural) (postinfection): Secondary | ICD-10-CM | POA: Diagnosis not present

## 2021-08-09 DIAGNOSIS — K219 Gastro-esophageal reflux disease without esophagitis: Secondary | ICD-10-CM | POA: Diagnosis not present

## 2021-08-09 DIAGNOSIS — Z433 Encounter for attention to colostomy: Secondary | ICD-10-CM | POA: Diagnosis not present

## 2021-08-09 DIAGNOSIS — K8000 Calculus of gallbladder with acute cholecystitis without obstruction: Secondary | ICD-10-CM | POA: Diagnosis not present

## 2021-08-09 DIAGNOSIS — Z6834 Body mass index (BMI) 34.0-34.9, adult: Secondary | ICD-10-CM | POA: Diagnosis not present

## 2021-08-09 DIAGNOSIS — M5430 Sciatica, unspecified side: Secondary | ICD-10-CM | POA: Diagnosis not present

## 2021-08-09 DIAGNOSIS — N390 Urinary tract infection, site not specified: Secondary | ICD-10-CM | POA: Diagnosis not present

## 2021-08-09 DIAGNOSIS — K828 Other specified diseases of gallbladder: Secondary | ICD-10-CM | POA: Diagnosis not present

## 2021-08-09 DIAGNOSIS — R188 Other ascites: Secondary | ICD-10-CM | POA: Diagnosis not present

## 2021-08-09 DIAGNOSIS — Z0389 Encounter for observation for other suspected diseases and conditions ruled out: Secondary | ICD-10-CM | POA: Diagnosis not present

## 2021-08-09 DIAGNOSIS — R109 Unspecified abdominal pain: Secondary | ICD-10-CM | POA: Diagnosis not present

## 2021-08-09 DIAGNOSIS — R630 Anorexia: Secondary | ICD-10-CM | POA: Diagnosis not present

## 2021-08-09 DIAGNOSIS — E669 Obesity, unspecified: Secondary | ICD-10-CM | POA: Diagnosis not present

## 2021-08-09 DIAGNOSIS — M1990 Unspecified osteoarthritis, unspecified site: Secondary | ICD-10-CM | POA: Diagnosis not present

## 2021-08-09 DIAGNOSIS — K624 Stenosis of anus and rectum: Secondary | ICD-10-CM | POA: Diagnosis not present

## 2021-08-09 DIAGNOSIS — R638 Other symptoms and signs concerning food and fluid intake: Secondary | ICD-10-CM | POA: Diagnosis not present

## 2021-08-09 DIAGNOSIS — K5792 Diverticulitis of intestine, part unspecified, without perforation or abscess without bleeding: Secondary | ICD-10-CM | POA: Diagnosis not present

## 2021-08-09 DIAGNOSIS — F32A Depression, unspecified: Secondary | ICD-10-CM | POA: Diagnosis not present

## 2021-08-09 DIAGNOSIS — Z933 Colostomy status: Secondary | ICD-10-CM | POA: Diagnosis not present

## 2021-08-09 DIAGNOSIS — K821 Hydrops of gallbladder: Secondary | ICD-10-CM | POA: Diagnosis not present

## 2021-08-19 DIAGNOSIS — N390 Urinary tract infection, site not specified: Secondary | ICD-10-CM | POA: Insufficient documentation

## 2021-09-05 DIAGNOSIS — Z09 Encounter for follow-up examination after completed treatment for conditions other than malignant neoplasm: Secondary | ICD-10-CM | POA: Diagnosis not present

## 2021-09-05 DIAGNOSIS — T8149XA Infection following a procedure, other surgical site, initial encounter: Secondary | ICD-10-CM | POA: Diagnosis not present

## 2021-09-19 DIAGNOSIS — Z09 Encounter for follow-up examination after completed treatment for conditions other than malignant neoplasm: Secondary | ICD-10-CM | POA: Diagnosis not present

## 2021-09-19 DIAGNOSIS — K56609 Unspecified intestinal obstruction, unspecified as to partial versus complete obstruction: Secondary | ICD-10-CM | POA: Diagnosis not present

## 2021-10-09 DIAGNOSIS — I10 Essential (primary) hypertension: Secondary | ICD-10-CM | POA: Diagnosis not present

## 2021-10-10 DIAGNOSIS — Z09 Encounter for follow-up examination after completed treatment for conditions other than malignant neoplasm: Secondary | ICD-10-CM | POA: Diagnosis not present

## 2021-10-10 DIAGNOSIS — K56699 Other intestinal obstruction unspecified as to partial versus complete obstruction: Secondary | ICD-10-CM | POA: Diagnosis not present

## 2021-10-17 DIAGNOSIS — K760 Fatty (change of) liver, not elsewhere classified: Secondary | ICD-10-CM | POA: Diagnosis not present

## 2021-10-17 DIAGNOSIS — I10 Essential (primary) hypertension: Secondary | ICD-10-CM | POA: Diagnosis not present

## 2021-10-17 DIAGNOSIS — F339 Major depressive disorder, recurrent, unspecified: Secondary | ICD-10-CM | POA: Diagnosis not present

## 2021-10-17 DIAGNOSIS — D509 Iron deficiency anemia, unspecified: Secondary | ICD-10-CM | POA: Insufficient documentation

## 2021-10-17 DIAGNOSIS — G500 Trigeminal neuralgia: Secondary | ICD-10-CM | POA: Diagnosis not present

## 2021-11-13 DIAGNOSIS — S31109S Unspecified open wound of abdominal wall, unspecified quadrant without penetration into peritoneal cavity, sequela: Secondary | ICD-10-CM | POA: Diagnosis not present

## 2021-11-13 DIAGNOSIS — S31109D Unspecified open wound of abdominal wall, unspecified quadrant without penetration into peritoneal cavity, subsequent encounter: Secondary | ICD-10-CM | POA: Diagnosis not present

## 2021-12-12 DIAGNOSIS — Z4889 Encounter for other specified surgical aftercare: Secondary | ICD-10-CM | POA: Diagnosis not present

## 2022-04-10 DIAGNOSIS — R197 Diarrhea, unspecified: Secondary | ICD-10-CM | POA: Diagnosis not present

## 2022-04-16 DIAGNOSIS — I10 Essential (primary) hypertension: Secondary | ICD-10-CM | POA: Diagnosis not present

## 2022-04-17 DIAGNOSIS — I10 Essential (primary) hypertension: Secondary | ICD-10-CM | POA: Diagnosis not present

## 2022-04-23 DIAGNOSIS — Z0000 Encounter for general adult medical examination without abnormal findings: Secondary | ICD-10-CM | POA: Diagnosis not present

## 2022-04-23 DIAGNOSIS — Z0001 Encounter for general adult medical examination with abnormal findings: Secondary | ICD-10-CM | POA: Diagnosis not present

## 2022-04-23 DIAGNOSIS — F339 Major depressive disorder, recurrent, unspecified: Secondary | ICD-10-CM | POA: Diagnosis not present

## 2022-04-23 DIAGNOSIS — I10 Essential (primary) hypertension: Secondary | ICD-10-CM | POA: Diagnosis not present

## 2022-04-23 DIAGNOSIS — K760 Fatty (change of) liver, not elsewhere classified: Secondary | ICD-10-CM | POA: Diagnosis not present

## 2022-04-23 DIAGNOSIS — D509 Iron deficiency anemia, unspecified: Secondary | ICD-10-CM | POA: Diagnosis not present

## 2022-04-23 DIAGNOSIS — G500 Trigeminal neuralgia: Secondary | ICD-10-CM | POA: Diagnosis not present

## 2022-05-29 DIAGNOSIS — H2512 Age-related nuclear cataract, left eye: Secondary | ICD-10-CM | POA: Diagnosis not present

## 2022-05-29 DIAGNOSIS — H353131 Nonexudative age-related macular degeneration, bilateral, early dry stage: Secondary | ICD-10-CM | POA: Diagnosis not present

## 2022-05-29 DIAGNOSIS — H43813 Vitreous degeneration, bilateral: Secondary | ICD-10-CM | POA: Diagnosis not present

## 2022-05-29 DIAGNOSIS — H2511 Age-related nuclear cataract, right eye: Secondary | ICD-10-CM | POA: Diagnosis not present

## 2022-06-04 DIAGNOSIS — H2511 Age-related nuclear cataract, right eye: Secondary | ICD-10-CM | POA: Diagnosis not present

## 2022-06-04 DIAGNOSIS — H2512 Age-related nuclear cataract, left eye: Secondary | ICD-10-CM | POA: Diagnosis not present

## 2022-06-06 ENCOUNTER — Encounter: Payer: Self-pay | Admitting: Ophthalmology

## 2022-06-13 DIAGNOSIS — M5430 Sciatica, unspecified side: Secondary | ICD-10-CM | POA: Diagnosis not present

## 2022-06-13 DIAGNOSIS — M25561 Pain in right knee: Secondary | ICD-10-CM | POA: Diagnosis not present

## 2022-06-13 DIAGNOSIS — G8929 Other chronic pain: Secondary | ICD-10-CM | POA: Diagnosis not present

## 2022-06-13 DIAGNOSIS — M48061 Spinal stenosis, lumbar region without neurogenic claudication: Secondary | ICD-10-CM | POA: Diagnosis not present

## 2022-06-13 DIAGNOSIS — M170 Bilateral primary osteoarthritis of knee: Secondary | ICD-10-CM | POA: Diagnosis not present

## 2022-06-13 DIAGNOSIS — M549 Dorsalgia, unspecified: Secondary | ICD-10-CM | POA: Diagnosis not present

## 2022-06-13 DIAGNOSIS — M25562 Pain in left knee: Secondary | ICD-10-CM | POA: Diagnosis not present

## 2022-06-13 NOTE — Discharge Instructions (Signed)

## 2022-06-16 DIAGNOSIS — M1711 Unilateral primary osteoarthritis, right knee: Secondary | ICD-10-CM | POA: Insufficient documentation

## 2022-06-16 DIAGNOSIS — M1712 Unilateral primary osteoarthritis, left knee: Principal | ICD-10-CM | POA: Insufficient documentation

## 2022-06-18 ENCOUNTER — Encounter: Payer: Self-pay | Admitting: Ophthalmology

## 2022-06-18 ENCOUNTER — Ambulatory Visit: Payer: PPO | Admitting: Anesthesiology

## 2022-06-18 ENCOUNTER — Ambulatory Visit
Admission: RE | Admit: 2022-06-18 | Discharge: 2022-06-18 | Disposition: A | Discharge status: Home / Self Care | Payer: PPO | Source: Ambulatory Visit | Attending: Ophthalmology | Admitting: Ophthalmology

## 2022-06-18 ENCOUNTER — Other Ambulatory Visit: Payer: Self-pay

## 2022-06-18 ENCOUNTER — Encounter
Admission: RE | Disposition: A | Discharge status: Home / Self Care | Payer: Self-pay | Source: Ambulatory Visit | Attending: Ophthalmology

## 2022-06-18 DIAGNOSIS — Z79899 Other long term (current) drug therapy: Secondary | ICD-10-CM | POA: Diagnosis not present

## 2022-06-18 DIAGNOSIS — E669 Obesity, unspecified: Secondary | ICD-10-CM | POA: Insufficient documentation

## 2022-06-18 DIAGNOSIS — Z6834 Body mass index (BMI) 34.0-34.9, adult: Secondary | ICD-10-CM | POA: Diagnosis not present

## 2022-06-18 DIAGNOSIS — M1990 Unspecified osteoarthritis, unspecified site: Secondary | ICD-10-CM | POA: Insufficient documentation

## 2022-06-18 DIAGNOSIS — H2511 Age-related nuclear cataract, right eye: Secondary | ICD-10-CM | POA: Insufficient documentation

## 2022-06-18 DIAGNOSIS — I10 Essential (primary) hypertension: Secondary | ICD-10-CM | POA: Diagnosis not present

## 2022-06-18 DIAGNOSIS — Z87891 Personal history of nicotine dependence: Secondary | ICD-10-CM | POA: Insufficient documentation

## 2022-06-18 HISTORY — PX: CATARACT EXTRACTION W/PHACO: SHX586

## 2022-06-18 HISTORY — DX: Unspecified osteoarthritis, unspecified site: M19.90

## 2022-06-18 SURGERY — PHACOEMULSIFICATION, CATARACT, WITH IOL INSERTION
Anesthesia: Monitor Anesthesia Care | Site: Eye | Laterality: Right

## 2022-06-18 MED ORDER — ARMC OPHTHALMIC DILATING DROPS
1.0000 | OPHTHALMIC | Status: DC | PRN
  Administered 2022-06-18 (×3): 1 via OPHTHALMIC

## 2022-06-18 MED ORDER — BRIMONIDINE TARTRATE-TIMOLOL 0.2-0.5 % OP SOLN
OPHTHALMIC | Status: DC | PRN
  Administered 2022-06-18: 1 [drp] via OPHTHALMIC

## 2022-06-18 MED ORDER — SIGHTPATH DOSE#1 BSS IO SOLN
INTRAOCULAR | Status: DC | PRN
  Administered 2022-06-18: 1 mL via INTRAMUSCULAR

## 2022-06-18 MED ORDER — TETRACAINE HCL 0.5 % OP SOLN
1.0000 [drp] | OPHTHALMIC | Status: DC | PRN
  Administered 2022-06-18 (×3): 1 [drp] via OPHTHALMIC

## 2022-06-18 MED ORDER — SIGHTPATH DOSE#1 BSS IO SOLN
INTRAOCULAR | Status: DC | PRN
  Administered 2022-06-18: 15 mL

## 2022-06-18 MED ORDER — MIDAZOLAM HCL 2 MG/2ML IJ SOLN
INTRAMUSCULAR | Status: DC | PRN
  Administered 2022-06-18: 2 mg via INTRAVENOUS

## 2022-06-18 MED ORDER — SIGHTPATH DOSE#1 BSS IO SOLN
INTRAOCULAR | Status: DC | PRN
  Administered 2022-06-18: 56 mL via OPHTHALMIC

## 2022-06-18 MED ORDER — LACTATED RINGERS IV SOLN: INTRAVENOUS | Status: DC

## 2022-06-18 MED ORDER — SIGHTPATH DOSE#1 NA CHONDROIT SULF-NA HYALURON 40-17 MG/ML IO SOLN
INTRAOCULAR | Status: DC | PRN
  Administered 2022-06-18: 1 mL via INTRAOCULAR

## 2022-06-18 MED ORDER — FENTANYL CITRATE (PF) 100 MCG/2ML IJ SOLN
INTRAMUSCULAR | Status: DC | PRN
  Administered 2022-06-18: 100 ug via INTRAVENOUS

## 2022-06-18 MED ORDER — MOXIFLOXACIN HCL 0.5 % OP SOLN
OPHTHALMIC | Status: DC | PRN
  Administered 2022-06-18: .2 mL via OPHTHALMIC

## 2022-06-18 SURGICAL SUPPLY — 9 items
CATARACT SUITE SIGHTPATH (MISCELLANEOUS) ×1 IMPLANT
FEE CATARACT SUITE SIGHTPATH (MISCELLANEOUS) ×1 IMPLANT
GLOVE BIOGEL PI IND STRL 8 (GLOVE) ×1 IMPLANT
GLOVE SURG ENC TEXT LTX SZ8 (GLOVE) ×1 IMPLANT
LENS IOL TECNIS EYHANCE 18.0 (Intraocular Lens) IMPLANT
NDL FILTER BLUNT 18X1 1/2 (NEEDLE) ×1 IMPLANT
NEEDLE FILTER BLUNT 18X1 1/2 (NEEDLE) ×1 IMPLANT
SYR 3ML LL SCALE MARK (SYRINGE) ×1 IMPLANT
WATER STERILE IRR 250ML POUR (IV SOLUTION) ×1 IMPLANT

## 2022-06-18 NOTE — Anesthesia Postprocedure Evaluation (Signed)
Anesthesia Post Note  Patient: Jean Khan  Procedure(s) Performed: CATARACT EXTRACTION PHACO AND INTRAOCULAR LENS PLACEMENT (IOC) RIGHT  6.10  00:44.6 (Right: Eye)  Patient location during evaluation: PACU Anesthesia Type: MAC Level of consciousness: awake and alert Pain management: pain level controlled Vital Signs Assessment: post-procedure vital signs reviewed and stable Respiratory status: spontaneous breathing, nonlabored ventilation, respiratory function stable and patient connected to nasal cannula oxygen Cardiovascular status: stable and blood pressure returned to baseline Postop Assessment: no apparent nausea or vomiting Anesthetic complications: no   No notable events documented.   Last Vitals:  Vitals:   06/18/22 1259 06/18/22 1300  BP: (!) 142/75 (!) 145/83  Pulse: 83 86  Resp: 18 19  Temp: 36.8 C   SpO2: 96% 97%    Last Pain:  Vitals:   06/18/22 1304  TempSrc:   PainSc: 0-No pain                 Arita Miss

## 2022-06-18 NOTE — Op Note (Signed)
PREOPERATIVE DIAGNOSIS:  Nuclear sclerotic cataract of the right eye.   POSTOPERATIVE DIAGNOSIS:  H25.11 Cataract   OPERATIVE PROCEDURE:ORPROCALL@   SURGEON:  Birder Robson, MD.   ANESTHESIA:  Anesthesiologist: Arita Miss, MD CRNA: Tobie Poet, CRNA  1.      Managed anesthesia care. 2.      0.40ml of Shugarcaine was instilled in the eye following the paracentesis.   COMPLICATIONS:  None.   TECHNIQUE:   Stop and chop   DESCRIPTION OF PROCEDURE:  The patient was examined and consented in the preoperative holding area where the aforementioned topical anesthesia was applied to the right eye and then brought back to the Operating Room where the right eye was prepped and draped in the usual sterile ophthalmic fashion and a lid speculum was placed. A paracentesis was created with the side port blade and the anterior chamber was filled with viscoelastic. A near clear corneal incision was performed with the steel keratome. A continuous curvilinear capsulorrhexis was performed with a cystotome followed by the capsulorrhexis forceps. Hydrodissection and hydrodelineation were carried out with BSS on a blunt cannula. The lens was removed in a stop and chop  technique and the remaining cortical material was removed with the irrigation-aspiration handpiece. The capsular bag was inflated with viscoelastic and the Technis [id number]  lens was placed in the capsular bag without complication. The remaining viscoelastic was removed from the eye with the irrigation-aspiration handpiece. The wounds were hydrated. The anterior chamber was flushed with BSS and the eye was inflated to physiologic pressure. 0.62ml of Vigamox was placed in the anterior chamber. The wounds were found to be water tight. The eye was dressed with Combigan. The patient was given protective glasses to wear throughout the day and a shield with which to sleep tonight. The patient was also given drops with which to begin a drop regimen today and  will follow-up with me in one day. Implant Name Type Inv. Item Serial No. Manufacturer Lot No. LRB No. Used Action  LENS IOL TECNIS EYHANCE 18.0 - [id number] Intraocular Lens LENS IOL TECNIS EYHANCE 18.0 [id number] [employer]  Right 1 Implanted   Procedure(s): CATARACT EXTRACTION PHACO AND INTRAOCULAR LENS PLACEMENT (IOC) RIGHT  6.10  00:44.6 (Right)  Electronically signed: Birder Robson 06/18/2022 12:57 PM

## 2022-06-18 NOTE — H&P (Signed)
[place]   Primary Care Physician:  Baxter Hire, MD Ophthalmologist: Dr.Poriflio  Pre-Procedure History & Physical: HPI:  Jean Khan is a 77 y.o. female here for cataract surgery.   Past Medical History:  Diagnosis Date   Arthritis    Hypertension     History reviewed. No pertinent surgical history.  Prior to Admission medications   Medication Sig Start Date End Date Taking? Authorizing Provider  amLODipine (NORVASC) 5 MG tablet Take 5 mg by mouth daily.   Yes [provider]  diphenhydrAMINE (BENADRYL) 25 mg capsule Take 25 mg by mouth at bedtime as needed.   Yes [provider]  hydrochlorothiazide (MICROZIDE) 12.5 MG capsule Take 12.5 mg by mouth daily.   Yes [provider]  HYDROcodone-acetaminophen (NORCO/VICODIN) 5-325 MG tablet Take 1 tablet by mouth every 6 (six) hours as needed for moderate pain.   Yes [provider]  lansoprazole (PREVACID) 15 MG capsule Take 15 mg by mouth daily at 12 noon.   Yes [provider]  naproxen sodium (ALEVE) 220 MG tablet Take 400 mg by mouth daily as needed.   Yes [provider]    Allergies as of 05/31/2022   (No Known Allergies)    Family History  Problem Relation Age of Onset   Ovarian cancer Daughter 24    Social History   Socioeconomic History   Marital status: Married    Spouse name: Not on file   Number of children: Not on file   Years of education: Not on file   Highest education level: Not on file  Occupational History   Not on file  Tobacco Use   Smoking status: Not on file   Smokeless tobacco: Not on file  Substance and Sexual Activity   Alcohol use: Not on file   Drug use: Not on file   Sexual activity: Not on file  Other Topics Concern   Not on file  Social History Narrative   Not on file   Social Determinants of Health   Financial Resource Strain: Not on file  Food Insecurity: Not on file  Transportation Needs: Not on file   Physical Activity: Not on file  Stress: Not on file  Social Connections: Not on file  Intimate Partner Violence: Not on file    Review of Systems: See HPI, otherwise negative ROS  Physical Exam: BP (!) 163/80   Pulse 87   Temp 97.8 F (36.6 C) (Temporal)   Resp 18   Ht 5\' 5"  (1.651 m)   Wt 95.3 kg   SpO2 97%   BMI 34.95 kg/m  General:   Alert, cooperative in NAD Head:  Normocephalic and atraumatic. Respiratory:  Normal work of breathing. Cardiovascular:  RRR  Impression/Plan: Jean Khan is here for cataract surgery.  Risks, benefits, limitations, and alternatives regarding cataract surgery have been reviewed with the patient.  Questions have been answered.  All parties agreeable.   Birder Robson, MD  06/18/2022, 12:29 PM

## 2022-06-18 NOTE — Anesthesia Preprocedure Evaluation (Signed)
Anesthesia Evaluation  Patient identified by MRN, date of birth, ID band Patient awake    Reviewed: Allergy & Precautions, NPO status , Patient's Chart, lab work & pertinent test results  History of Anesthesia Complications Negative for: history of anesthetic complications  Airway Mallampati: II  TM Distance: >3 FB Neck ROM: Full    Dental  (+) Lower Dentures   Pulmonary neg pulmonary ROS, neg sleep apnea, neg COPD, Patient abstained from smoking.Not current smoker   Pulmonary exam normal breath sounds clear to auscultation       Cardiovascular Exercise Tolerance: Good METShypertension, Pt. on medications (-) CAD and (-) Past MI (-) dysrhythmias  Rhythm:Regular Rate:Normal - Systolic murmurs    Neuro/Psych negative neurological ROS  negative psych ROS   GI/Hepatic ,neg GERD  ,,(+)     (-) substance abuse    Endo/Other  neg diabetes    Renal/GU negative Renal ROS     Musculoskeletal  (+) Arthritis ,  On opioids, 10mg  hydrocodone daily   Abdominal  (+) + obese  Peds  Hematology   Anesthesia Other Findings Past Medical History: No date: Arthritis No date: Hypertension  Reproductive/Obstetrics                              Anesthesia Physical Anesthesia Plan  ASA: 2  Anesthesia Plan: MAC   Post-op Pain Management:    Induction: Intravenous  PONV Risk Score and Plan: 2 and Midazolam  Airway Management Planned: Nasal Cannula  Additional Equipment:   Intra-op Plan:   Post-operative Plan:   Informed Consent: I have reviewed the patients History and Physical, chart, labs and discussed the procedure including the risks, benefits and alternatives for the proposed anesthesia with the patient or authorized representative who has indicated his/her understanding and acceptance.       Plan Discussed with: CRNA and Surgeon  Anesthesia Plan Comments: (Explained risks of  anesthesia, including PONV, and rare emergencies such as cardiac events, respiratory problems, and allergic reactions, requiring invasive intervention. Discussed the role of CRNA in patient's perioperative care. Patient understands. )         Anesthesia Quick Evaluation

## 2022-06-18 NOTE — Transfer of Care (Signed)
Immediate Anesthesia Transfer of Care Note  Patient: Jean Khan  Procedure(s) Performed: CATARACT EXTRACTION PHACO AND INTRAOCULAR LENS PLACEMENT (IOC) RIGHT  6.10  00:44.6 (Right: Eye)  Patient Location: PACU  Anesthesia Type: MAC  Level of Consciousness: awake, alert  and patient cooperative  Airway and Oxygen Therapy: Patient Spontanous Breathing and Patient connected to supplemental oxygen  Post-op Assessment: Post-op Vital signs reviewed, Patient's Cardiovascular Status Stable, Respiratory Function Stable, Patent Airway and No signs of Nausea or vomiting  Post-op Vital Signs: Reviewed and stable  Complications: No notable events documented.

## 2022-06-19 ENCOUNTER — Encounter: Payer: Self-pay | Admitting: Ophthalmology

## 2022-06-19 ENCOUNTER — Other Ambulatory Visit: Payer: Self-pay

## 2022-06-20 DIAGNOSIS — R600 Localized edema: Secondary | ICD-10-CM | POA: Diagnosis not present

## 2022-06-24 DIAGNOSIS — H2512 Age-related nuclear cataract, left eye: Secondary | ICD-10-CM | POA: Diagnosis not present

## 2022-07-04 NOTE — Discharge Instructions (Signed)

## 2022-07-08 DIAGNOSIS — M1711 Unilateral primary osteoarthritis, right knee: Secondary | ICD-10-CM | POA: Diagnosis not present

## 2022-07-09 ENCOUNTER — Encounter: Payer: Self-pay | Admitting: Ophthalmology

## 2022-07-09 ENCOUNTER — Ambulatory Visit: Payer: PPO | Admitting: Anesthesiology

## 2022-07-09 ENCOUNTER — Ambulatory Visit
Admission: RE | Admit: 2022-07-09 | Discharge: 2022-07-09 | Disposition: A | Discharge status: Home / Self Care | Payer: PPO | Attending: Ophthalmology | Admitting: Ophthalmology

## 2022-07-09 ENCOUNTER — Other Ambulatory Visit: Payer: Self-pay

## 2022-07-09 ENCOUNTER — Encounter
Admission: RE | Disposition: A | Discharge status: Home / Self Care | Payer: Self-pay | Source: Home / Self Care | Attending: Ophthalmology

## 2022-07-09 DIAGNOSIS — Z79891 Long term (current) use of opiate analgesic: Secondary | ICD-10-CM | POA: Insufficient documentation

## 2022-07-09 DIAGNOSIS — I10 Essential (primary) hypertension: Secondary | ICD-10-CM | POA: Diagnosis not present

## 2022-07-09 DIAGNOSIS — H2512 Age-related nuclear cataract, left eye: Secondary | ICD-10-CM | POA: Diagnosis not present

## 2022-07-09 DIAGNOSIS — Z6836 Body mass index (BMI) 36.0-36.9, adult: Secondary | ICD-10-CM | POA: Diagnosis not present

## 2022-07-09 DIAGNOSIS — E669 Obesity, unspecified: Secondary | ICD-10-CM | POA: Diagnosis not present

## 2022-07-09 DIAGNOSIS — M1990 Unspecified osteoarthritis, unspecified site: Secondary | ICD-10-CM | POA: Insufficient documentation

## 2022-07-09 HISTORY — PX: CATARACT EXTRACTION W/PHACO: SHX586

## 2022-07-09 SURGERY — PHACOEMULSIFICATION, CATARACT, WITH IOL INSERTION
Anesthesia: Monitor Anesthesia Care | Site: Eye | Laterality: Left

## 2022-07-09 MED ORDER — SIGHTPATH DOSE#1 BSS IO SOLN
INTRAOCULAR | Status: DC | PRN
  Administered 2022-07-09: 39 mL via OPHTHALMIC

## 2022-07-09 MED ORDER — MOXIFLOXACIN HCL 0.5 % OP SOLN
OPHTHALMIC | Status: DC | PRN
  Administered 2022-07-09: .2 mL via OPHTHALMIC

## 2022-07-09 MED ORDER — FENTANYL CITRATE (PF) 100 MCG/2ML IJ SOLN
INTRAMUSCULAR | Status: DC | PRN
  Administered 2022-07-09 (×2): 50 ug via INTRAVENOUS

## 2022-07-09 MED ORDER — TETRACAINE HCL 0.5 % OP SOLN
1.0000 [drp] | OPHTHALMIC | Status: DC | PRN
  Administered 2022-07-09 (×3): 1 [drp] via OPHTHALMIC

## 2022-07-09 MED ORDER — SIGHTPATH DOSE#1 NA CHONDROIT SULF-NA HYALURON 40-17 MG/ML IO SOLN
INTRAOCULAR | Status: DC | PRN
  Administered 2022-07-09: 1 mL via INTRAOCULAR

## 2022-07-09 MED ORDER — SIGHTPATH DOSE#1 BSS IO SOLN
INTRAOCULAR | Status: DC | PRN
  Administered 2022-07-09: 15 mL via INTRAOCULAR

## 2022-07-09 MED ORDER — MIDAZOLAM HCL 2 MG/2ML IJ SOLN
INTRAMUSCULAR | Status: DC | PRN
  Administered 2022-07-09 (×2): 1 mg via INTRAVENOUS

## 2022-07-09 MED ORDER — BRIMONIDINE TARTRATE-TIMOLOL 0.2-0.5 % OP SOLN
OPHTHALMIC | Status: DC | PRN
  Administered 2022-07-09: 1 [drp] via OPHTHALMIC

## 2022-07-09 MED ORDER — ARMC OPHTHALMIC DILATING DROPS
1.0000 | OPHTHALMIC | Status: DC | PRN
  Administered 2022-07-09 (×3): 1 via OPHTHALMIC

## 2022-07-09 MED ORDER — SIGHTPATH DOSE#1 BSS IO SOLN
INTRAOCULAR | Status: DC | PRN
  Administered 2022-07-09: 2 mL

## 2022-07-09 SURGICAL SUPPLY — 14 items
CANNULA ANT/CHMB 27G (MISCELLANEOUS) IMPLANT
CANNULA ANT/CHMB 27GA (MISCELLANEOUS) IMPLANT
CATARACT SUITE SIGHTPATH (MISCELLANEOUS) ×1 IMPLANT
FEE CATARACT SUITE SIGHTPATH (MISCELLANEOUS) ×1 IMPLANT
GLOVE BIOGEL PI IND STRL 8 (GLOVE) ×1 IMPLANT
GLOVE SURG ENC TEXT LTX SZ8 (GLOVE) ×1 IMPLANT
LENS IOL TECNIS EYHANCE 19.0 (Intraocular Lens) IMPLANT
NDL FILTER BLUNT 18X1 1/2 (NEEDLE) ×1 IMPLANT
NEEDLE FILTER BLUNT 18X1 1/2 (NEEDLE) ×1 IMPLANT
PACK VIT ANT 23G (MISCELLANEOUS) IMPLANT
RING MALYGIN (MISCELLANEOUS) IMPLANT
SUT ETHILON 10-0 CS-B-6CS-B-6 (SUTURE)
SUTURE EHLN 10-0 CS-B-6CS-B-6 (SUTURE) IMPLANT
SYR 3ML LL SCALE MARK (SYRINGE) ×1 IMPLANT

## 2022-07-09 NOTE — Transfer of Care (Signed)
Immediate Anesthesia Transfer of Care Note  Patient: Jean Khan  Procedure(s) Performed: CATARACT EXTRACTION PHACO AND INTRAOCULAR LENS PLACEMENT (IOC) LEFT 6.30 00:33.4 (Left: Eye)  Patient Location: PACU  Anesthesia Type: MAC  Level of Consciousness: awake, alert  and patient cooperative  Airway and Oxygen Therapy: Patient Spontanous Breathing and Patient connected to supplemental oxygen  Post-op Assessment: Post-op Vital signs reviewed, Patient's Cardiovascular Status Stable, Respiratory Function Stable, Patent Airway and No signs of Nausea or vomiting  Post-op Vital Signs: Reviewed and stable  Complications: No notable events documented.

## 2022-07-09 NOTE — H&P (Signed)
[facility]   Primary Care Physician:  Gracelyn Nurse, MD Ophthalmologist: Dr. Maren Reamer  Pre-Procedure History & Physical: HPI:  Jean Khan is a 77 y.o. female here for cataract surgery.   Past Medical History:  Diagnosis Date   Arthritis    Hypertension     Past Surgical History:  Procedure Laterality Date   CATARACT EXTRACTION W/PHACO Right 06/18/2022   Procedure: CATARACT EXTRACTION PHACO AND INTRAOCULAR LENS PLACEMENT (IOC) RIGHT  6.10  00:44.6;  Surgeon: Galen Manila, MD;  Location: [facility];  Service: Ophthalmology;  Laterality: Right;    Prior to Admission medications   Medication Sig Start Date End Date Taking? Authorizing Provider  amLODipine (NORVASC) 5 MG tablet Take 5 mg by mouth daily.    [provider]  diphenhydrAMINE (BENADRYL) 25 mg capsule Take 25 mg by mouth at bedtime as needed.    [provider]  hydrochlorothiazide (MICROZIDE) 12.5 MG capsule Take 12.5 mg by mouth daily.    [provider]  HYDROcodone-acetaminophen (NORCO/VICODIN) 5-325 MG tablet Take 1 tablet by mouth every 6 (six) hours as needed for moderate pain.    [provider]  lansoprazole (PREVACID) 15 MG capsule Take 15 mg by mouth daily at 12 noon.    [provider]  naproxen sodium (ALEVE) 220 MG tablet Take 400 mg by mouth daily as needed.    [provider]    Allergies as of 05/31/2022   (No Known Allergies)    Family History  Problem Relation Age of Onset   Ovarian cancer Daughter 63    Social History   Socioeconomic History   Marital status: Married    Spouse name: Not on file   Number of children: Not on file   Years of education: Not on file   Highest education level: Not on file  Occupational History   Not on file  Tobacco Use   Smoking status: Not on file   Smokeless tobacco: Not on file  Substance and Sexual Activity   Alcohol use: Not on file   Drug use: Not on file   Sexual  activity: Not on file  Other Topics Concern   Not on file  Social History Narrative   Not on file   Social Determinants of Health   Financial Resource Strain: Not on file  Food Insecurity: Not on file  Transportation Needs: Not on file  Physical Activity: Not on file  Stress: Not on file  Social Connections: Not on file  Intimate Partner Violence: Not on file    Review of Systems: See HPI, otherwise negative ROS  Physical Exam: BP (!) 145/84   Pulse 69   Temp (!) 97.5 F (36.4 C) (Temporal)   Resp 16   Ht  (1.651 m)   Wt 99.9 kg   SpO2 96%   BMI 36.66 kg/m  General:   Alert, cooperative in NAD Head:  Normocephalic and atraumatic. Respiratory:  Normal work of breathing. Cardiovascular:  RRR  Impression/Plan: Jean Khan is here for cataract surgery.  Risks, benefits, limitations, and alternatives regarding cataract surgery have been reviewed with the patient.  Questions have been answered.  All parties agreeable.   Galen Manila, MD  07/09/2022, 10:18 AM

## 2022-07-09 NOTE — Anesthesia Preprocedure Evaluation (Signed)
Anesthesia Evaluation  Patient identified by MRN, date of birth, ID band Patient awake    Reviewed: Allergy & Precautions, NPO status , Patient's Chart, lab work & pertinent test results  History of Anesthesia Complications Negative for: history of anesthetic complications  Airway Mallampati: II  TM Distance: >3 FB Neck ROM: Full    Dental  (+) Lower Dentures   Pulmonary neg pulmonary ROS, neg sleep apnea, neg COPD, Patient abstained from smoking.Not current smoker   Pulmonary exam normal breath sounds clear to auscultation       Cardiovascular Exercise Tolerance: Good METShypertension, Pt. on medications (-) CAD and (-) Past MI (-) dysrhythmias  Rhythm:Regular Rate:Normal - Systolic murmurs    Neuro/Psych negative neurological ROS  negative psych ROS   GI/Hepatic ,neg GERD  ,,(+)     (-) substance abuse    Endo/Other  neg diabetes    Renal/GU negative Renal ROS     Musculoskeletal  (+) Arthritis ,  On opioids, 10mg hydrocodone daily   Abdominal  (+) + obese  Peds  Hematology   Anesthesia Other Findings Past Medical History: No date: Arthritis No date: Hypertension  Reproductive/Obstetrics                              Anesthesia Physical Anesthesia Plan  ASA: 2  Anesthesia Plan: MAC   Post-op Pain Management:    Induction: Intravenous  PONV Risk Score and Plan: 2 and Midazolam  Airway Management Planned: Nasal Cannula  Additional Equipment:   Intra-op Plan:   Post-operative Plan:   Informed Consent: I have reviewed the patients History and Physical, chart, labs and discussed the procedure including the risks, benefits and alternatives for the proposed anesthesia with the patient or authorized representative who has indicated his/her understanding and acceptance.       Plan Discussed with: CRNA and Surgeon  Anesthesia Plan Comments: (Explained risks of  anesthesia, including PONV, and rare emergencies such as cardiac events, respiratory problems, and allergic reactions, requiring invasive intervention. Discussed the role of CRNA in patient's perioperative care. Patient understands. )         Anesthesia Quick Evaluation  

## 2022-07-09 NOTE — Anesthesia Postprocedure Evaluation (Signed)
Anesthesia Post Note  Patient: Jean Khan  Procedure(s) Performed: CATARACT EXTRACTION PHACO AND INTRAOCULAR LENS PLACEMENT (IOC) LEFT 6.30 00:33.4 (Left: Eye)  Patient location during evaluation: PACU Anesthesia Type: MAC Level of consciousness: awake and alert Pain management: pain level controlled Vital Signs Assessment: post-procedure vital signs reviewed and stable Respiratory status: spontaneous breathing, nonlabored ventilation, respiratory function stable and patient connected to nasal cannula oxygen Cardiovascular status: stable and blood pressure returned to baseline Postop Assessment: no apparent nausea or vomiting Anesthetic complications: no  No notable events documented.   Last Vitals:  Vitals:   07/09/22 1039 07/09/22 1045  BP: 132/74 132/77  Pulse: 73   Resp: (!) 22   Temp: (!) 36.4 C (!) 36.3 C  SpO2: 97%     Last Pain:  Vitals:   07/09/22 1045  TempSrc:   PainSc: 0-No pain                 Stephanie Coup

## 2022-07-09 NOTE — Op Note (Signed)
PREOPERATIVE DIAGNOSIS:  Nuclear sclerotic cataract of the left eye.   POSTOPERATIVE DIAGNOSIS:  Nuclear sclerotic cataract of the left eye.   OPERATIVE PROCEDURE:ORPROCALL@   SURGEON:  Galen Manila, MD.   ANESTHESIA:  Anesthesiologist: Stephanie Coup, MD CRNA: Barbette Hair, CRNA  1.      Managed anesthesia care. 2.     0.27ml of Shugarcaine was instilled following the paracentesis   COMPLICATIONS:  None.   TECHNIQUE:   Stop and chop   DESCRIPTION OF PROCEDURE:  The patient was examined and consented in the preoperative holding area where the aforementioned topical anesthesia was applied to the left eye and then brought back to the Operating Room where the left eye was prepped and draped in the usual sterile ophthalmic fashion and a lid speculum was placed. A paracentesis was created with the side port blade and the anterior chamber was filled with viscoelastic. A near clear corneal incision was performed with the steel keratome. A continuous curvilinear capsulorrhexis was performed with a cystotome followed by the capsulorrhexis forceps. Hydrodissection and hydrodelineation were carried out with BSS on a blunt cannula. The lens was removed in a stop and chop  technique and the remaining cortical material was removed with the irrigation-aspiration handpiece. The capsular bag was inflated with viscoelastic and the Technis [id number] lens was placed in the capsular bag without complication. The remaining viscoelastic was removed from the eye with the irrigation-aspiration handpiece. The wounds were hydrated. The anterior chamber was flushed with BSS and the eye was inflated to physiologic pressure. 0.49ml Vigamox was placed in the anterior chamber. The wounds were found to be water tight. The eye was dressed with Combigan. The patient was given protective glasses to wear throughout the day and a shield with which to sleep tonight. The patient was also given drops with which to begin a drop regimen  today and will follow-up with me in one day. Implant Name Type Inv. Item Serial No. Manufacturer Lot No. LRB No. Used Action  LENS IOL TECNIS EYHANCE 19.0 - [id number] Intraocular Lens LENS IOL TECNIS EYHANCE 19.0 [id number] [employer]  Left 1 Implanted    Procedure(s): CATARACT EXTRACTION PHACO AND INTRAOCULAR LENS PLACEMENT (IOC) LEFT 6.30 00:33.4 (Left)  Electronically signed: Galen Manila 07/09/2022 10:39 AM

## 2022-07-10 ENCOUNTER — Encounter: Payer: Self-pay | Admitting: Ophthalmology

## 2022-07-12 NOTE — Progress Notes (Unsigned)
Referring Physician:  Donato Heinz, MD [address] [facility] [place],  [place] [address]  Primary Physician:  Gracelyn Nurse, MD  History of Present Illness: 07/16/2022 Ms. Marguerite Barba has a history of HTN.   Has seen PMR at [facility] in the past and she does not think they helped. Last visit was 07/18/20.   She has 3-4 year history of constant LBP with radiation into her hips, and bilateral legs (more anterior) to her feet, left > right. She has intermittent numbness and tingling in the legs as well. Pain is worse with walking and standing. No weakness noted.   She is not able to walk much at all due to pain. Uses scooter at the store.   She also needs to have left knee replaced.   Bowel/Bladder Dysfunction: none  Conservative measures:  Physical therapy: has not participated in recently, but it hasn't been helpful in the past  Multimodal medical therapy including regular antiinflammatories: tylenol, norco, mobic, ultram, prednisone  Injections:  05/25/2020: Left S1 transforaminal ESI (dexamethasone 9 mg)) 03/02/2020: Left S1 transforaminal ESI (moderate to good relief, dexamethasone 9 mg) 06/17/2019: Right L5-S1 and left S1 transforaminal ESI (headache x1 day and insomnia, 2 weeks good relief)   Past Surgery: denies  HYACINTH MARCELLI has no symptoms of cervical myelopathy.  The symptoms are causing a significant impact on the patient's life.   Review of Systems:  A 10 point review of systems is negative, except for the pertinent positives and negatives detailed in the HPI.  Past Medical History: Past Medical History:  Diagnosis Date   Arthritis    Hypertension     Past Surgical History: Past Surgical History:  Procedure Laterality Date   CATARACT EXTRACTION W/PHACO Right 06/18/2022   Procedure: CATARACT EXTRACTION PHACO AND INTRAOCULAR LENS PLACEMENT (IOC) RIGHT  6.10  00:44.6;  Surgeon: Galen Manila, MD;  Location: [facility];  Service:  Ophthalmology;  Laterality: Right;   CATARACT EXTRACTION W/PHACO Left 07/09/2022   Procedure: CATARACT EXTRACTION PHACO AND INTRAOCULAR LENS PLACEMENT (IOC) LEFT 6.30 00:33.4;  Surgeon: Galen Manila, MD;  Location: [facility];  Service: Ophthalmology;  Laterality: Left;    Allergies: Allergies as of 07/16/2022   (No Known Allergies)    Medications: Outpatient Encounter Medications as of 07/16/2022  Medication Sig   acetaminophen (TYLENOL) 500 MG tablet Take 500 mg by mouth every 6 (six) hours as needed.   amLODipine (NORVASC) 5 MG tablet Take 5 mg by mouth daily.   diphenhydrAMINE (BENADRYL) 25 mg capsule Take 25 mg by mouth at bedtime as needed.   hydrochlorothiazide (MICROZIDE) 12.5 MG capsule Take 12.5 mg by mouth daily.   HYDROcodone-acetaminophen (NORCO/VICODIN) 5-325 MG tablet Take 1 tablet by mouth every 6 (six) hours as needed for moderate pain.   lansoprazole (PREVACID) 15 MG capsule Take 15 mg by mouth daily at 12 noon.   naproxen sodium (ALEVE) 220 MG tablet Take 400 mg by mouth daily as needed.   No facility-administered encounter medications on file as of 07/16/2022.    Social History: Social History   Tobacco Use   Smoking status: Never   Smokeless tobacco: Never    Family Medical History: Family History  Problem Relation Age of Onset   Ovarian cancer Daughter 38    Physical Examination: Vitals:   07/16/22 1059  BP: 132/84    General: Patient is well developed, well nourished, calm, collected, and in no apparent distress. Attention to examination is appropriate.  Respiratory: Patient is breathing without any difficulty.   NEUROLOGICAL:     Awake, alert, oriented to person, place, and time.  Speech is clear and fluent. Fund of knowledge is appropriate.   Cranial Nerves: Pupils equal round and reactive to light.  Facial tone is symmetric.    Mild lower posterior lumbar tenderness.   No abnormal lesions on exposed skin.    Strength: Side Biceps Triceps Deltoid Interossei Grip Wrist Ext. Wrist Flex.  R 5 5 5 5 5 5 5   L 5 5 5 5 5 5 5    Side Iliopsoas Quads Hamstring PF DF EHL  R 5 5 5 5 5 5   L 5 5 5 5 5 5    Reflexes are 2+ and symmetric at the biceps, triceps, brachioradialis, and achilles.  Patella DTR not tested ather request.  Hoffman's is absent.  Clonus is not present.   Bilateral upper and lower extremity sensation is intact to light touch.     Gait not tested. She is in a WC.   Medical Decision Making  Imaging: MRI of lumbar spine dated 05/05/20:  FINDINGS: Segmentation:  Standard.   Alignment:  Stable with levocurvature.   Vertebrae: Stable vertebral body heights with diffuse degenerative endplate irregularity. Marrow signal is mildly heterogeneous similar to prior without substantial edema or suspicious osseous lesion. There is a hemangioma involving the left posterior elements of T12.   Conus medullaris and cauda equina: Conus extends to the L1 level. Conus and cauda equina appear normal.   Paraspinal and other soft tissues: Unremarkable.   Disc levels:   T12-L1: Minimal disc bulge. No canal or foraminal stenosis. Appearance is similar.   L1-L2: Disc bulge slightly eccentric to the right. Mild facet arthropathy with ligamentum flavum infolding. No canal stenosis. Mild right foraminal stenosis. No left foraminal stenosis. Appearance is similar.   L2-L3: Disc bulge with superimposed left subarticular protrusion. Moderate facet arthropathy with ligamentum flavum infolding. Increased moderate canal stenosis. Increased narrowing of the left greater than right subarticular recesses. Moderate right and mild left foraminal stenosis are similar.   L3-L4: Disc bulge. Marked right and moderate left facet arthropathy with ligamentum flavum infolding. Moderate canal stenosis. Partial effacement of the subarticular recesses. Mild to moderate right foraminal stenosis. Minor left  foraminal stenosis. Appearance is similar.   L4-L5: Disc bulge with superimposed central disc protrusion. Moderate to marked right and moderate left facet arthropathy with ligamentum flavum infolding. Marked canal stenosis with effacement of subarticular recesses. Mild to moderate foraminal stenosis. Appearance is similar.   L5-S1: Disc bulge with endplate osteophytic ridging. Moderate facet arthropathy with ligamentum flavum infolding. Minor canal stenosis. Partial effacement of the subarticular recesses. Mild to moderate right and moderate left foraminal stenosis. Appearance is similar.   IMPRESSION: Advanced multilevel degenerative changes as detailed above overall similar in appearance to the prior study. As before, there is marked canal stenosis at L4-L5 with narrowing of the subarticular recesses. There is increased moderate canal stenosis at L2-L3 with increased subarticular recess narrowing.     Electronically Signed   By: Guadlupe Spanish M.D.   On: 05/05/2020 11:57   I have personally reviewed the images and agree with the above interpretation.  Assessment and Plan: Ms. Egle is a pleasant 77 y.o. female has 3-4 year history of constant LBP with radiation into her hips, and bilateral legs (more anterior) to her feet, left > right. Pain is worse with walking and standing. She is not able to walk much at all  due to pain. Uses scooter at the store.   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   LBP is likely multifactorial. Leg symptoms are likely due to spinal stenosis.   Treatment options discussed with patient and following plan made:   - MRI of lumbar spine to further evaluate lumbar radiculopathy/spinal stenosis. No relief with medications, time, or previous injections.  - Lumbar xrays including flexion/extension to evaluate possible  slip L4-L5. Will get same time as MRI.  - Recommend she use rollator when ambulating.  - Continue on norco per PCP.  - She is not interested in further lumbar injections.  - She is interested in surgery options for her lumbar spine. Discussed she would need to revisit PT prior to any surgical discussion.  - Will review MRI/xrays with Dr. Myer Haff and then see her back in clinic to review them. Can revisit PT at that time if needed.   I spent a total of 40 minutes in face-to-face and non-face-to-face activities related to this patient's care today including review of outside records, review of imaging, review of symptoms, physical exam, discussion of differential diagnosis, discussion of treatment options, and documentation.   Thank you for involving me in the care of this patient.   Drake Leach PA-C Dept. of Neurosurgery

## 2022-07-15 DIAGNOSIS — M1711 Unilateral primary osteoarthritis, right knee: Secondary | ICD-10-CM | POA: Diagnosis not present

## 2022-07-16 ENCOUNTER — Ambulatory Visit (INDEPENDENT_AMBULATORY_CARE_PROVIDER_SITE_OTHER): Payer: PPO | Admitting: Orthopedic Surgery

## 2022-07-16 ENCOUNTER — Encounter: Payer: Self-pay | Admitting: Orthopedic Surgery

## 2022-07-16 VITALS — BP 132/84 | Ht 65.0 in | Wt 220.0 lb

## 2022-07-16 DIAGNOSIS — M5136 Other intervertebral disc degeneration, lumbar region: Secondary | ICD-10-CM

## 2022-07-16 DIAGNOSIS — M5416 Radiculopathy, lumbar region: Secondary | ICD-10-CM

## 2022-07-16 DIAGNOSIS — M48061 Spinal stenosis, lumbar region without neurogenic claudication: Secondary | ICD-10-CM

## 2022-07-16 DIAGNOSIS — M47816 Spondylosis without myelopathy or radiculopathy, lumbar region: Secondary | ICD-10-CM

## 2022-07-16 DIAGNOSIS — M4726 Other spondylosis with radiculopathy, lumbar region: Secondary | ICD-10-CM | POA: Diagnosis not present

## 2022-07-16 NOTE — Patient Instructions (Signed)
It was so nice to see you today. Thank you so much for coming in.    You have wear and tear in your back along with spinal stenosis (pressure on the spinal cord). I think this is causing your back and leg pain.   I want to get an updated MRI of your lower back to look into things further. We will get this approved through your insurance and [facility] will call you to schedule the appointment.   When you get the MRI done, I also want you to get updated lower back xrays.   Discuss any changes in pain medication with your PCP.   I recommend using your rollator when you are walking.   Once I get the MRI results back, I will review with Dr. Myer Haff and we will call you with a follow up to discuss them.   Please do not hesitate to call if you have any questions or concerns. You can also message me in MyChart.   If you have not heard back about the MRI/xrays in the next week, please call the office so we can help you get them scheduled.   Drake Leach PA-C [phone]

## 2022-07-22 ENCOUNTER — Telehealth: Payer: Self-pay | Admitting: Neurosurgery

## 2022-07-22 DIAGNOSIS — M5416 Radiculopathy, lumbar region: Secondary | ICD-10-CM

## 2022-07-22 DIAGNOSIS — M48061 Spinal stenosis, lumbar region without neurogenic claudication: Secondary | ICD-10-CM

## 2022-07-22 DIAGNOSIS — M47816 Spondylosis without myelopathy or radiculopathy, lumbar region: Secondary | ICD-10-CM

## 2022-07-22 DIAGNOSIS — M1711 Unilateral primary osteoarthritis, right knee: Secondary | ICD-10-CM | POA: Diagnosis not present

## 2022-07-22 DIAGNOSIS — M5136 Other intervertebral disc degeneration, lumbar region: Secondary | ICD-10-CM

## 2022-07-22 NOTE — Telephone Encounter (Signed)
Jean Khan's last note confirms she will have to revisit PT prior to surgery. Referral placed. It is up to the physical therapist how long/how many visits. If she and the physical therapist feel it is not helping, they can discharge her sooner rather than later.

## 2022-07-22 NOTE — Telephone Encounter (Signed)
Was advised at last appt that her insurance ([employer]) may want her to complete PT prior to authorizing sx even though the provider she saw said it may not help her any. Said if this is necessary she would like to go ahead and get the referral sent to [facility] and get it started instead of waiting.   CB: [phone]

## 2022-07-22 NOTE — Telephone Encounter (Signed)
Left message for patient that her referral as been sent.

## 2022-07-25 DIAGNOSIS — M545 Low back pain, unspecified: Secondary | ICD-10-CM | POA: Diagnosis not present

## 2022-07-29 DIAGNOSIS — Z961 Presence of intraocular lens: Secondary | ICD-10-CM | POA: Diagnosis not present

## 2022-07-30 ENCOUNTER — Ambulatory Visit
Admission: RE | Admit: 2022-07-30 | Discharge: 2022-07-30 | Disposition: A | Discharge status: Home / Self Care | Payer: PPO | Source: Ambulatory Visit | Attending: Orthopedic Surgery | Admitting: Orthopedic Surgery

## 2022-07-30 DIAGNOSIS — M47816 Spondylosis without myelopathy or radiculopathy, lumbar region: Secondary | ICD-10-CM | POA: Diagnosis not present

## 2022-07-30 DIAGNOSIS — M545 Low back pain, unspecified: Secondary | ICD-10-CM | POA: Diagnosis not present

## 2022-07-30 DIAGNOSIS — M5136 Other intervertebral disc degeneration, lumbar region: Secondary | ICD-10-CM | POA: Insufficient documentation

## 2022-07-30 DIAGNOSIS — M5416 Radiculopathy, lumbar region: Secondary | ICD-10-CM | POA: Insufficient documentation

## 2022-07-30 DIAGNOSIS — M4316 Spondylolisthesis, lumbar region: Secondary | ICD-10-CM | POA: Diagnosis not present

## 2022-07-30 DIAGNOSIS — M48061 Spinal stenosis, lumbar region without neurogenic claudication: Secondary | ICD-10-CM | POA: Insufficient documentation

## 2022-08-02 DIAGNOSIS — M545 Low back pain, unspecified: Secondary | ICD-10-CM | POA: Diagnosis not present

## 2022-08-06 NOTE — Progress Notes (Unsigned)
Referring Physician:  Gracelyn Nurse, MD [address] [place],  [place] [address]  Primary Physician:  Gracelyn Nurse, MD  History of Present Illness: 08/07/2022 Ms. Harmani Kowall has a history of HTN.   Last seen by me on 07/16/22 for LBP and bilateral leg pain. MRI from 2022 showed probable slip at L4-L5 with severe central stenosis, mild/moderate central stenosis L2-L3, moderate central stenosis L3-L4, and multilevel foraminal stenosis.   She is here to review the lumbar MRI and xrays that I ordered at her last visit.   Her symptoms are about the same. She continues with constant LBP with radiation into her hips, and bilateral legs (more anterior) to her feet, left > right. She has intermittent numbness and tingling in the legs as well. Pain is worse with walking and standing. No weakness noted.   She is not able to walk much at all due to pain. Uses scooter at the store.   PCP has her on norco.   Bowel/Bladder Dysfunction: none  Conservative measures:  Physical therapy: has not participated in recently, but it hasn't been helpful in the past  Multimodal medical therapy including regular antiinflammatories: tylenol, norco, mobic, ultram, prednisone  Injections:  05/25/2020: Left S1 transforaminal ESI (dexamethasone 9 mg)) 03/02/2020: Left S1 transforaminal ESI (moderate to good relief, dexamethasone 9 mg) 06/17/2019: Right L5-S1 and left S1 transforaminal ESI (headache x1 day and insomnia, 2 weeks good relief)   Past Surgery: denies  CAMYLA ROBARDS has no symptoms of cervical myelopathy.  The symptoms are causing a significant impact on the patient's life.   Review of Systems:  A 10 point review of systems is negative, except for the pertinent positives and negatives detailed in the HPI.  Past Medical History: Past Medical History:  Diagnosis Date   Arthritis    Hypertension     Past Surgical History: Past Surgical History:  Procedure Laterality Date    CATARACT EXTRACTION W/PHACO Right 06/18/2022   Procedure: CATARACT EXTRACTION PHACO AND INTRAOCULAR LENS PLACEMENT (IOC) RIGHT  6.10  00:44.6;  Surgeon: Galen Manila, MD;  Location: [facility];  Service: Ophthalmology;  Laterality: Right;   CATARACT EXTRACTION W/PHACO Left 07/09/2022   Procedure: CATARACT EXTRACTION PHACO AND INTRAOCULAR LENS PLACEMENT (IOC) LEFT 6.30 00:33.4;  Surgeon: Galen Manila, MD;  Location: [facility];  Service: Ophthalmology;  Laterality: Left;    Allergies: Allergies as of 08/07/2022   (No Known Allergies)    Medications: Outpatient Encounter Medications as of 08/07/2022  Medication Sig   acetaminophen (TYLENOL) 500 MG tablet Take 500 mg by mouth every 6 (six) hours as needed.   amLODipine (NORVASC) 5 MG tablet Take 5 mg by mouth daily.   diphenhydrAMINE (BENADRYL) 25 mg capsule Take 25 mg by mouth at bedtime as needed.   hydrochlorothiazide (MICROZIDE) 12.5 MG capsule Take 12.5 mg by mouth daily.   HYDROcodone-acetaminophen (NORCO/VICODIN) 5-325 MG tablet Take 1 tablet by mouth every 6 (six) hours as needed for moderate pain.   lansoprazole (PREVACID) 15 MG capsule Take 15 mg by mouth daily at 12 noon.   naproxen sodium (ALEVE) 220 MG tablet Take 400 mg by mouth daily as needed.   No facility-administered encounter medications on file as of 08/07/2022.    Social History: Social History   Tobacco Use   Smoking status: Never   Smokeless tobacco: Never    Family Medical History: Family History  Problem Relation Age of Onset   Ovarian cancer Daughter 70  Physical Examination: Vitals:   08/07/22 1509  BP: 133/70    Awake, alert, oriented to person, place, and time.  Speech is clear and fluent. Fund of knowledge is appropriate.   Cranial Nerves: Pupils equal round and reactive to light.  Facial tone is symmetric.    No abnormal lesions on exposed skin.   Strength:  Side Iliopsoas Quads Hamstring PF DF EHL  R 5 5 5 5  5 5   L 5 5 5 5 5 5    Reflexes are 2+ and symmetric at the patella and achilles.    Bilateral lower extremity sensation is intact to light touch.     Gait not tested. She is in a WC.   Medical Decision Making  Imaging: MRI of lumbar spine dated 07/30/22:   FINDINGS: Segmentation: 5 non rib-bearing lumbar type vertebral bodies are present. The lowest fully formed vertebral body is L5.   Alignment: Slight anterolisthesis is present at L4-5. Minimal retrolisthesis at L1-2 and L2-3 is stable. No other significant listhesis is present. Mild leftward curvature is centered at L3.   Vertebrae: Type 2 Modic changes are most prominent on the left L4-5 and on right L2-3. Asymmetric right-sided type 2 Modic changes at L1-2 and L2-3 demonstrates slight progression. Vertebral body heights are normal. No focal osseous lesions are present.   Conus medullaris and cauda equina: Conus extends to the L1 level. Conus and cauda equina appear normal.   Paraspinal and other soft tissues: Limited imaging the abdomen is unremarkable. There is no significant adenopathy. No solid organ lesions are present.   Disc levels:   T12-L1: Mild disc desiccation is present without significant stenosis or change.   L1-2: A broad-based disc protrusion is present. Mild subarticular narrowing on the right is stable. Mild foraminal narrowing is stable, right greater than left.   L2-3: Broad-based disc protrusion is present. Subarticular and moderate bilateral foraminal stenosis is worse right than left, slightly progressed from the prior exam.   L3-4: Broad-based disc protrusion is present. Moderate facet hypertrophy and ligamentum flavum thickening is again noted. Subarticular narrowing is present bilaterally. Moderate right foraminal stenosis demonstrates slight progression. Mild left foraminal narrowing is stable.   L4-5: A broad-based disc protrusion is present. Moderate central canal stenosis is stable  with left greater than right subarticular narrowing. Moderate foraminal stenosis is stable.   L5-S1: A prominent central disc protrusion has progressed. Moderate subarticular stenosis is present, left greater than right. Moderate foraminal stenosis is similar to the prior study.   IMPRESSION: 1. Multilevel spondylosis of the lumbar spine with some progression as described. 2. Mild subarticular and foraminal narrowing bilaterally at L1-2 is stable, right greater than left. 3. Subarticular and moderate bilateral foraminal stenosis at L2-3 is worse on the right, slightly progressed from the prior exam. 4. Subarticular narrowing bilaterally at L3-4 with moderate right foraminal stenosis and mild left foraminal narrowing. 5. Moderate central canal stenosis at L4-5 with left greater than right subarticular narrowing and moderate foraminal stenosis bilaterally. 6. Moderate subarticular and foraminal stenosis bilaterally at L5-S1 is similar to the prior study.     Electronically Signed   By: Marin Roberts M.D.   On: 08/03/2022 11:49    Xrays of lumbar spine dated 07/30/22:  FINDINGS: Assume the T12 ribs are hypoplastic. There is levoscoliosis with the apex at L2-L3. Grade 1 anterolisthesis of L4 on L5 minimally changes with flexion or extension. Minimal anterolisthesis of L3 and L4 may slightly increase with flexion. Disc space narrowing  and endplate changes at L2-L3. Vertebral body heights are maintained. Evidence for multilevel facet disease.   IMPRESSION: 1. Scoliosis and degenerative changes in lumbar spine. 2. Grade 1 anterolisthesis at L3-L4 and L4-L5. Anterolisthesis at L3-L4 may slightly increase with flexion.     Electronically Signed   By: Richarda Overlie M.D.   On: 08/03/2022 07:47  I have personally reviewed the images and agree with the above interpretation.  Assessment and Plan: Ms. Hindman is a pleasant 77 y.o. female has constant LBP with radiation into her  hips, and bilateral legs (more anterior) to her feet, left > right. Pain is worse with walking and standing. She is not able to walk much at all due to pain. Uses scooter at the store.   She has mild central stenosis L2-L3, moderate central stenosis L3-L4 with slip that appears to increase on flexion, and severe central stenosis at L4-L5 with a slip. She also has slight scoliosis and multilevel foraminal stenosis.   LBP is likely multifactorial. Leg symptoms are likely due to spinal stenosis.   Treatment options discussed with patient and following plan made:   - Continue with PT at [facility]. Will need to finish this out prior to discussing any surgery options.  - She would like to explore medical management of her pain. Referral done to [facility].  - She is not interested in further lumbar injections.  - Follow up with Dr. Myer Haff in 4-6 weeks for evaluation. Will need to be done with PT prior to this visit.   I spent a total of 25 minutes in face-to-face and non-face-to-face activities related to this patient's care today including review of outside records, review of imaging, review of symptoms, physical exam, discussion of differential diagnosis, discussion of treatment options, and documentation.   Drake Leach PA-C Dept. of Neurosurgery

## 2022-08-07 ENCOUNTER — Ambulatory Visit (INDEPENDENT_AMBULATORY_CARE_PROVIDER_SITE_OTHER): Payer: PPO | Admitting: Orthopedic Surgery

## 2022-08-07 ENCOUNTER — Encounter: Payer: Self-pay | Admitting: Orthopedic Surgery

## 2022-08-07 VITALS — BP 133/70 | Ht 65.0 in | Wt 220.0 lb

## 2022-08-07 DIAGNOSIS — M419 Scoliosis, unspecified: Secondary | ICD-10-CM

## 2022-08-07 DIAGNOSIS — M4726 Other spondylosis with radiculopathy, lumbar region: Secondary | ICD-10-CM | POA: Diagnosis not present

## 2022-08-07 DIAGNOSIS — M4316 Spondylolisthesis, lumbar region: Secondary | ICD-10-CM | POA: Diagnosis not present

## 2022-08-07 DIAGNOSIS — M5416 Radiculopathy, lumbar region: Secondary | ICD-10-CM

## 2022-08-07 DIAGNOSIS — M48061 Spinal stenosis, lumbar region without neurogenic claudication: Secondary | ICD-10-CM | POA: Diagnosis not present

## 2022-08-07 DIAGNOSIS — M5136 Other intervertebral disc degeneration, lumbar region: Secondary | ICD-10-CM | POA: Diagnosis not present

## 2022-08-07 DIAGNOSIS — M47816 Spondylosis without myelopathy or radiculopathy, lumbar region: Secondary | ICD-10-CM

## 2022-08-07 NOTE — Patient Instructions (Addendum)
It was so nice to see you today. Thank you so much for coming in.    You have wear and tear in your back along with spinal stenosis (pressure on the spinal cord). I think this is causing your back and leg pain.   Continue with PT at [facility]. You should be done with PT before your follow up with Dr. Myer Haff. Call us if we need to push the appointment back.   I put in a referral for you to see [facility] to discuss medical management of your pain. Their address is:  [address], [place], [place] [address] Phone: [phone]  You have a follow up   Please do not hesitate to call if you have any questions or concerns. You can also message me in MyChart.    Drake Leach PA-C [phone]

## 2022-08-14 DIAGNOSIS — M545 Low back pain, unspecified: Secondary | ICD-10-CM | POA: Diagnosis not present

## 2022-08-22 ENCOUNTER — Ambulatory Visit: Payer: PPO | Admitting: Orthopedic Surgery

## 2022-09-04 NOTE — Progress Notes (Signed)
Referring Physician:  Drake Leach, PA-C [address] [place],  [place] [address]  Primary Physician:  Gracelyn Nurse, MD  History of Present Illness: 09/05/2022 Jean Khan is here today with a chief complaint of low back pain that radiates into her hips and bilateral legs into her feet, left side is worse. She has intermittent numbness and tingling in the legs as well.  She has been having issues for 3 to 4 years.  She has severe pain down her left leg with standing and walking.  Unfortunate, this complicated by known left knee osteoarthritis.  She has tried physical therapy without improvement. t Bowel/Bladder Dysfunction: none  Conservative measures:  Physical therapy: has participated in at [facility] Multimodal medical therapy including regular antiinflammatories:  tylenol, norco, mobic, ultram, prednisone  Injections: has not received epidural steroid injections 05/25/2020: Left S1 transforaminal ESI (dexamethasone 9 mg)) 03/02/2020: Left S1 transforaminal ESI (moderate to good relief, dexamethasone 9 mg) 06/17/2019: Right L5-S1 and left S1 transforaminal ESI (headache x1 day and insomnia, 2 weeks good relief)   Past Surgery: denies  Jean Khan has no symptoms of cervical myelopathy.  The symptoms are causing a significant impact on the patient's life.   Progress Note from Tanya Nones, [place] on 08/07/22:   History of Present Illness: 08/07/2022 Jean Khan has a history of HTN.    Last seen by me on 07/16/22 for LBP and bilateral leg pain. MRI from 2022 showed probable slip at L4-L5 with severe central stenosis, mild/moderate central stenosis L2-L3, moderate central stenosis L3-L4, and multilevel foraminal stenosis.   She is here to review the lumbar MRI and xrays that I ordered at her last visit.    Her symptoms are about the same. She continues with constant LBP with radiation into her hips, and bilateral legs (more anterior) to her feet, left  > right. She has intermittent numbness and tingling in the legs as well. Pain is worse with walking and standing. No weakness noted.    She is not able to walk much at all due to pain. Uses scooter at the store.    PCP has her on norco.   I have utilized the care everywhere function in epic to review the outside records available from external health systems.  Review of Systems:  A 10 point review of systems is negative, except for the pertinent positives and negatives detailed in the HPI.  Past Medical History: Past Medical History:  Diagnosis Date   Arthritis    Hypertension     Past Surgical History: Past Surgical History:  Procedure Laterality Date   CATARACT EXTRACTION W/PHACO Right 06/18/2022   Procedure: CATARACT EXTRACTION PHACO AND INTRAOCULAR LENS PLACEMENT (IOC) RIGHT  6.10  00:44.6;  Surgeon: Galen Manila, MD;  Location: [facility];  Service: Ophthalmology;  Laterality: Right;   CATARACT EXTRACTION W/PHACO Left 07/09/2022   Procedure: CATARACT EXTRACTION PHACO AND INTRAOCULAR LENS PLACEMENT (IOC) LEFT 6.30 00:33.4;  Surgeon: Galen Manila, MD;  Location: [facility];  Service: Ophthalmology;  Laterality: Left;    Allergies: Allergies as of 09/05/2022   (No Known Allergies)    Medications:  Current Outpatient Medications:    acetaminophen (TYLENOL) 500 MG tablet, Take 500 mg by mouth every 6 (six) hours as needed., Disp: , Rfl:    amLODipine (NORVASC) 5 MG tablet, Take 5 mg by mouth daily., Disp: , Rfl:    diphenhydrAMINE (BENADRYL) 25 mg capsule, Take 25 mg by mouth  at bedtime as needed., Disp: , Rfl:    hydrochlorothiazide (MICROZIDE) 12.5 MG capsule, Take 12.5 mg by mouth daily., Disp: , Rfl:    HYDROcodone-acetaminophen (NORCO/VICODIN) 5-325 MG tablet, Take 1 tablet by mouth every 6 (six) hours as needed for moderate pain., Disp: , Rfl:    lansoprazole (PREVACID) 15 MG capsule, Take 15 mg by mouth daily at 12 noon., Disp: , Rfl:     naproxen sodium (ALEVE) 220 MG tablet, Take 400 mg by mouth daily as needed., Disp: , Rfl:   Social History: Social History   Tobacco Use   Smoking status: Never   Smokeless tobacco: Never    Family Medical History: Family History  Problem Relation Age of Onset   Ovarian cancer Daughter 76    Physical Examination: Vitals:   09/05/22 1000  BP: 120/72    General: Patient is in no apparent distress. Attention to examination is appropriate.  Neck:   Supple.  Full range of motion.  Respiratory: Patient is breathing without any difficulty.   NEUROLOGICAL:     Awake, alert, oriented to person, place, and time.  Speech is clear and fluent.   Cranial Nerves: Pupils equal round and reactive to light.  Facial tone is symmetric.  Facial sensation is symmetric. Shoulder shrug is symmetric. Tongue protrusion is midline.  There is no pronator drift.  Strength: Side Biceps Triceps Deltoid Interossei Grip Wrist Ext. Wrist Flex.  R 5 5 5 5 5 5 5   L 5 5 5 5 5 5 5    Side Iliopsoas Quads Hamstring PF DF EHL  R 5 5 5 5 5 5   L 5 5 5 5 5 5    Reflexes are 1+ and symmetric at the biceps, triceps, brachioradialis, patella and achilles.   Hoffman's is absent.   Bilateral upper and lower extremity sensation is intact to light touch.   She has left buttock pain with straight leg raise at 45 degrees. No evidence of dysmetria noted.  Gait is abnormal.     Medical Decision Making  Imaging: MRI L spine 08/03/2022 Disc levels:   T12-L1: Mild disc desiccation is present without significant stenosis or change.   L1-2: A broad-based disc protrusion is present. Mild subarticular narrowing on the right is stable. Mild foraminal narrowing is stable, right greater than left.   L2-3: Broad-based disc protrusion is present. Subarticular and moderate bilateral foraminal stenosis is worse right than left, slightly progressed from the prior exam.   L3-4: Broad-based disc protrusion is present.  Moderate facet hypertrophy and ligamentum flavum thickening is again noted. Subarticular narrowing is present bilaterally. Moderate right foraminal stenosis demonstrates slight progression. Mild left foraminal narrowing is stable.   L4-5: A broad-based disc protrusion is present. Moderate central canal stenosis is stable with left greater than right subarticular narrowing. Moderate foraminal stenosis is stable.   L5-S1: A prominent central disc protrusion has progressed. Moderate subarticular stenosis is present, left greater than right. Moderate foraminal stenosis is similar to the prior study.  IMPRESSION: 1. Multilevel spondylosis of the lumbar spine with some progression as described. 2. Mild subarticular and foraminal narrowing bilaterally at L1-2 is stable, right greater than left. 3. Subarticular and moderate bilateral foraminal stenosis at L2-3 is worse on the right, slightly progressed from the prior exam. 4. Subarticular narrowing bilaterally at L3-4 with moderate right foraminal stenosis and mild left foraminal narrowing. 5. Moderate central canal stenosis at L4-5 with left greater than right subarticular narrowing and moderate foraminal stenosis bilaterally. 6. Moderate  subarticular and foraminal stenosis bilaterally at L5-S1 is similar to the prior study.     Electronically Signed   By: Marin Roberts M.D.   On: 08/03/2022 11:49  I have personally reviewed the images and agree with the above interpretation.  Assessment and Plan: Jean Khan is a pleasant 77 y.o. 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  She also has a coronal plane abnormality, but I would not recommend a more extensive surgical intervention than lumbar decompression from L3-S1.  She has exhausted conservative management including physical therapy.  I recommended L3-S1 lumbar decompression.  I discussed the planned procedure at length  with the patient, including the risks, benefits, alternatives, and indications. The risks discussed include but are not limited to bleeding, infection, need for reoperation, spinal fluid leak, stroke, vision loss, anesthetic complication, coma, paralysis, and even death. I also described in detail that improvement was not guaranteed.  The patient expressed understanding of these risks, and asked that we proceed with surgery. I described the surgery in layman's terms, and gave ample opportunity for questions, which were answered to the best of my ability.  I spent a total of 30 minutes in this patient's care today. This time was spent reviewing pertinent records including imaging studies, obtaining and confirming history, performing a directed evaluation, formulating and discussing my recommendations, and documenting the visit within the medical record.     Thank you for involving me in the care of this patient.      Quantavius Humm K. Myer Haff MD, [facility] Neurosurgery

## 2022-09-04 NOTE — H&P (View-Only) (Signed)
  Referring Physician:  Luna, Stacy, PA-C [address] [place],  [place] [address]  Primary Physician:  Johnston, John D, MD  History of Present Illness: 09/05/2022 [name] is here today with a chief complaint of low back pain that radiates into her hips and bilateral legs into her feet, left side is worse. She has intermittent numbness and tingling in the legs as well.  She has been having issues for 3 to 4 years.  She has severe pain down her left leg with standing and walking.  Unfortunate, this complicated by known left knee osteoarthritis.  She has tried physical therapy without improvement. t Bowel/Bladder Dysfunction: none  Conservative measures:  Physical therapy: has participated in at [facility] including regular antiinflammatories:  tylenol, norco, mobic, ultram, prednisone  Injections: has not received epidural steroid injections 05/25/2020: Left S1 transforaminal ESI (dexamethasone 9 mg)) 03/02/2020: Left S1 transforaminal ESI (moderate to good relief, dexamethasone 9 mg) 06/17/2019: Right L5-S1 and left S1 transforaminal ESI (headache x1 day and insomnia, 2 weeks good relief)   Past Surgery: denies  Josetta C Niven has no symptoms of cervical myelopathy.  The symptoms are causing a significant impact on the patient's life.   Progress Note from Stacy una, PA on 08/07/22:   History of Present Illness: 08/07/2022 [name] has a history of HTN.    Last seen by me on 07/16/22 for LBP and bilateral leg pain. MRI from 2022 showed probable slip at L4-L5 with severe central stenosis, mild/moderate central stenosis L2-L3, moderate central stenosis L3-L4, and multilevel foraminal stenosis.   She is here to review the lumbar MRI and xrays that I ordered at her last visit.    Her symptoms are about the same. She continues with constant LBP with radiation into her hips, and bilateral legs (more anterior) to her feet, left  > right. She has intermittent numbness and tingling in the legs as well. Pain is worse with walking and standing. No weakness noted.    She is not able to walk much at all due to pain. Uses scooter at the store.    PCP has her on norco.   I have utilized the care everywhere function in epic to review the outside records available from external health systems.  Review of Systems:  A 10 point review of systems is negative, except for the pertinent positives and negatives detailed in the HPI.  Past Medical History: Past Medical History:  Diagnosis Date   Arthritis    Hypertension     Past Surgical History: Past Surgical History:  Procedure Laterality Date   CATARACT EXTRACTION W/PHACO Right 06/18/2022   Procedure: CATARACT EXTRACTION PHACO AND INTRAOCULAR LENS PLACEMENT (IOC) RIGHT  6.10  00:44.6;  Surgeon: Porfilio, William, MD;  Location: [facility];  Service: Ophthalmology;  Laterality: Right;   CATARACT EXTRACTION W/PHACO Left 07/09/2022   Procedure: CATARACT EXTRACTION PHACO AND INTRAOCULAR LENS PLACEMENT (IOC) LEFT 6.30 00:33.4;  Surgeon: Porfilio, William, MD;  Location: [facility];  Service: Ophthalmology;  Laterality: Left;    Allergies: Allergies as of 09/05/2022   (No Known Allergies)    Medications:  Current Outpatient Medications:    acetaminophen (TYLENOL) 500 MG tablet, Take 500 mg by mouth every 6 (six) hours as needed., Disp: , Rfl:    amLODipine (NORVASC) 5 MG tablet, Take 5 mg by mouth daily., Disp: , Rfl:    diphenhydrAMINE (BENADRYL) 25 mg capsule, Take 25 mg by mouth   at bedtime as needed., Disp: , Rfl:    hydrochlorothiazide (MICROZIDE) 12.5 MG capsule, Take 12.5 mg by mouth daily., Disp: , Rfl:    HYDROcodone-acetaminophen (NORCO/VICODIN) 5-325 MG tablet, Take 1 tablet by mouth every 6 (six) hours as needed for moderate pain., Disp: , Rfl:    lansoprazole (PREVACID) 15 MG capsule, Take 15 mg by mouth daily at 12 noon., Disp: , Rfl:     naproxen sodium (ALEVE) 220 MG tablet, Take 400 mg by mouth daily as needed., Disp: , Rfl:   Social History: Social History   Tobacco Use   Smoking status: Never   Smokeless tobacco: Never    Family Medical History: Family History  Problem Relation Age of Onset   Ovarian cancer Daughter 43    Physical Examination: Vitals:   09/05/22 1000  BP: 120/72    General: Patient is in no apparent distress. Attention to examination is appropriate.  Neck:   Supple.  Full range of motion.  Respiratory: Patient is breathing without any difficulty.   NEUROLOGICAL:     Awake, alert, oriented to person, place, and time.  Speech is clear and fluent.   Cranial Nerves: Pupils equal round and reactive to light.  Facial tone is symmetric.  Facial sensation is symmetric. Shoulder shrug is symmetric. Tongue protrusion is midline.  There is no pronator drift.  Strength: Side Biceps Triceps Deltoid Interossei Grip Wrist Ext. Wrist Flex.  R 5 5 5 5 5 5 5  L 5 5 5 5 5 5 5   Side Iliopsoas Quads Hamstring PF DF EHL  R 5 5 5 5 5 5  L 5 5 5 5 5 5   Reflexes are 1+ and symmetric at the biceps, triceps, brachioradialis, patella and achilles.   Hoffman's is absent.   Bilateral upper and lower extremity sensation is intact to light touch.   She has left buttock pain with straight leg raise at 45 degrees. No evidence of dysmetria noted.  Gait is abnormal.     Medical Decision Making  Imaging: MRI L spine 08/03/2022 Disc levels:   T12-L1: Mild disc desiccation is present without significant stenosis or change.   L1-2: A broad-based disc protrusion is present. Mild subarticular narrowing on the right is stable. Mild foraminal narrowing is stable, right greater than left.   L2-3: Broad-based disc protrusion is present. Subarticular and moderate bilateral foraminal stenosis is worse right than left, slightly progressed from the prior exam.   L3-4: Broad-based disc protrusion is present.  Moderate facet hypertrophy and ligamentum flavum thickening is again noted. Subarticular narrowing is present bilaterally. Moderate right foraminal stenosis demonstrates slight progression. Mild left foraminal narrowing is stable.   L4-5: A broad-based disc protrusion is present. Moderate central canal stenosis is stable with left greater than right subarticular narrowing. Moderate foraminal stenosis is stable.   L5-S1: A prominent central disc protrusion has progressed. Moderate subarticular stenosis is present, left greater than right. Moderate foraminal stenosis is similar to the prior study.  IMPRESSION: 1. Multilevel spondylosis of the lumbar spine with some progression as described. 2. Mild subarticular and foraminal narrowing bilaterally at L1-2 is stable, right greater than left. 3. Subarticular and moderate bilateral foraminal stenosis at L2-3 is worse on the right, slightly progressed from the prior exam. 4. Subarticular narrowing bilaterally at L3-4 with moderate right foraminal stenosis and mild left foraminal narrowing. 5. Moderate central canal stenosis at L4-5 with left greater than right subarticular narrowing and moderate foraminal stenosis bilaterally. 6. Moderate   subarticular and foraminal stenosis bilaterally at L5-S1 is similar to the prior study.     Electronically Signed   By: Christopher  Mattern M.D.   On: 08/03/2022 11:49  I have personally reviewed the images and agree with the above interpretation.  Assessment and Plan: Ms. Cerritos is a pleasant 76 y.o. 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  She also has a coronal plane abnormality, but I would not recommend a more extensive surgical intervention than lumbar decompression from L3-S1.  She has exhausted conservative management including physical therapy.  I recommended L3-S1 lumbar decompression.  I discussed the planned procedure at length  with the patient, including the risks, benefits, alternatives, and indications. The risks discussed include but are not limited to bleeding, infection, need for reoperation, spinal fluid leak, stroke, vision loss, anesthetic complication, coma, paralysis, and even death. I also described in detail that improvement was not guaranteed.  The patient expressed understanding of these risks, and asked that we proceed with surgery. I described the surgery in layman's terms, and gave ample opportunity for questions, which were answered to the best of my ability.  I spent a total of 30 minutes in this patient's care today. This time was spent reviewing pertinent records including imaging studies, obtaining and confirming history, performing a directed evaluation, formulating and discussing my recommendations, and documenting the visit within the medical record.     Thank you for involving me in the care of this patient.      Dawsyn Zurn K. Tomma Ehinger MD, MPHS Neurosurgery  

## 2022-09-05 ENCOUNTER — Encounter: Payer: Self-pay | Admitting: Neurosurgery

## 2022-09-05 ENCOUNTER — Other Ambulatory Visit: Payer: Self-pay

## 2022-09-05 ENCOUNTER — Ambulatory Visit (INDEPENDENT_AMBULATORY_CARE_PROVIDER_SITE_OTHER): Payer: PPO | Admitting: Neurosurgery

## 2022-09-05 VITALS — BP 120/72 | Ht 65.0 in | Wt 220.0 lb

## 2022-09-05 DIAGNOSIS — M48062 Spinal stenosis, lumbar region with neurogenic claudication: Secondary | ICD-10-CM | POA: Diagnosis not present

## 2022-09-05 DIAGNOSIS — Z01818 Encounter for other preprocedural examination: Secondary | ICD-10-CM

## 2022-09-05 NOTE — Patient Instructions (Signed)
Please see below for information in regards to your upcoming surgery:   Planned surgery: L3-S1 decompression   Surgery date: 10/02/22 - you will find out your arrival time the business day before your surgery.   Pre-op appointment at [facility] Pre-admit Testing: we will call you with a date/time for this. Pre-admit testing is located on the first floor of the [facility] building, [address] [facility] [address], [address]. Please bring all prescriptions in the original prescription bottles to your appointment, even if you have reviewed medications by phone with a pharmacy representative. Pre-op labs may be done at your pre-op appointment. You are not required to fast for these labs. Should you need to change your pre-op appointment, please call Pre-admit testing at [phone].    Surgical clearance: we will send a clearance form to Dr Letitia Libra     If you have FMLA/disability paperwork, please drop it off or fax it to [phone], attention Patty.    How to contact us:  If you have any questions/concerns before or after surgery, you can reach [place] at [phone], or you can send a mychart message. We can be reached by phone or mychart 8am-4pm, Monday-Friday.  *Please note: Calls after 4pm are forwarded to a third party answering service. Mychart messages are not routinely monitored during evenings, weekends, and holidays. Please call our office to contact the answering service for urgent concerns during non-business hours.    Appointments/FMLA & disability paperwork: Patty & Cristin  Nurse: Royston Cowper  Medical assistants: Laurann Montana Physician Assistant's: Manning Charity & Drake Leach Surgeon: Venetia Night, MD

## 2022-09-19 ENCOUNTER — Ambulatory Visit: Payer: PPO | Admitting: Neurosurgery

## 2022-09-20 ENCOUNTER — Encounter
Admission: RE | Admit: 2022-09-20 | Discharge: 2022-09-20 | Disposition: A | Discharge status: Home / Self Care | Payer: PPO | Source: Ambulatory Visit | Attending: Neurosurgery | Admitting: Neurosurgery

## 2022-09-20 ENCOUNTER — Other Ambulatory Visit: Payer: Self-pay

## 2022-09-20 DIAGNOSIS — R8290 Unspecified abnormal findings in urine: Secondary | ICD-10-CM | POA: Diagnosis not present

## 2022-09-20 DIAGNOSIS — Z01818 Encounter for other preprocedural examination: Secondary | ICD-10-CM | POA: Insufficient documentation

## 2022-09-20 DIAGNOSIS — Z01810 Encounter for preprocedural cardiovascular examination: Secondary | ICD-10-CM | POA: Diagnosis not present

## 2022-09-20 DIAGNOSIS — M48062 Spinal stenosis, lumbar region with neurogenic claudication: Secondary | ICD-10-CM | POA: Insufficient documentation

## 2022-09-20 DIAGNOSIS — Z01812 Encounter for preprocedural laboratory examination: Secondary | ICD-10-CM

## 2022-09-20 HISTORY — DX: Spinal stenosis, lumbar region with neurogenic claudication: M48.062

## 2022-09-20 HISTORY — DX: Spinal stenosis, lumbar region without neurogenic claudication: M48.061

## 2022-09-20 HISTORY — DX: Unilateral primary osteoarthritis, left knee: M17.12

## 2022-09-20 HISTORY — DX: Headache, unspecified: R51.9

## 2022-09-20 HISTORY — DX: Unilateral primary osteoarthritis, right knee: M17.11

## 2022-09-20 LAB — CBC
HCT: 43.2 % (ref 36.0–46.0)
Hemoglobin: 13.4 g/dL (ref 12.0–15.0)
MCH: 27.9 pg (ref 26.0–34.0)
MCHC: 31 g/dL (ref 30.0–36.0)
MCV: 89.8 fL (ref 80.0–100.0)
Platelets: 355 10*3/uL (ref 150–400)
RBC: 4.81 MIL/uL (ref 3.87–5.11)
RDW: 16.2 % — ABNORMAL HIGH (ref 11.5–15.5)
WBC: 7.5 10*3/uL (ref 4.0–10.5)
nRBC: 0 % (ref 0.0–0.2)

## 2022-09-20 LAB — TYPE AND SCREEN
ABO/RH(D): O POS
Antibody Screen: NEGATIVE

## 2022-09-20 LAB — URINALYSIS, COMPLETE (UACMP) WITH MICROSCOPIC
Bilirubin Urine: NEGATIVE
Glucose, UA: NEGATIVE mg/dL
Ketones, ur: NEGATIVE mg/dL
Nitrite: NEGATIVE
Protein, ur: NEGATIVE mg/dL
Specific Gravity, Urine: 1.011 (ref 1.005–1.030)
pH: 7 (ref 5.0–8.0)

## 2022-09-20 LAB — SURGICAL PCR SCREEN
MRSA, PCR: NEGATIVE
Staphylococcus aureus: POSITIVE — AB

## 2022-09-20 LAB — BASIC METABOLIC PANEL
Anion gap: 10 (ref 5–15)
BUN: 12 mg/dL (ref 8–23)
CO2: 27 mmol/L (ref 22–32)
Calcium: 9.6 mg/dL (ref 8.9–10.3)
Chloride: 104 mmol/L (ref 98–111)
Creatinine, Ser: 0.97 mg/dL (ref 0.44–1.00)
GFR, Estimated: >60 mL/min (ref >60)
Glucose, Bld: 109 mg/dL — ABNORMAL HIGH (ref 70–99)
Potassium: 3.8 mmol/L (ref 3.5–5.1)
Sodium: 141 mmol/L (ref 135–145)

## 2022-09-20 NOTE — Patient Instructions (Addendum)
Your procedure is scheduled on:10/02/2022 Report to the Registration Desk on the 1st floor of the [facility]. To find out your arrival time, please call [phone] between 1PM - 3PM on: 10/01/2022 If your arrival time is 6:00 am, do not arrive before that time as the [facility] entrance doors do not open until 6:00 am.  REMEMBER: Instructions that are not followed completely may result in serious medical risk, up to and including death; or upon the discretion of your surgeon and anesthesiologist your surgery may need to be rescheduled.  Do not eat food after midnight the night before surgery.  No gum chewing or hard candies.  You may however, drink CLEAR liquids up to 2 hours before you are scheduled to arrive for your surgery. Do not drink anything within 2 hours of your scheduled arrival time.  Clear liquids include: - water  - apple juice without pulp - gatorade (not RED colors) - black coffee or tea (Do NOT add milk or creamers to the coffee or tea) Do NOT drink anything that is not on this list.  One week prior to surgery: Stop ANY OVER THE COUNTER supplements until after surgery. You may however, continue to take Tylenol if needed for pain up until the day of surgery.  Continue taking all prescribed medications as prescribed.  TAKE ONLY THESE MEDICATIONS THE MORNING OF SURGERY WITH A SIP OF WATER:  lansoprazole (PREVACID)  amLODipine (NORVASC)   No Alcohol for 24 hours before or after surgery.  No Smoking including e-cigarettes for 24 hours before surgery.  No chewable tobacco products for at least 6 hours before surgery.  No nicotine patches on the day of surgery.  Do not use any "recreational" drugs for at least a week (preferably 2 weeks) before your surgery.  Please be advised that the combination of cocaine and anesthesia may have negative outcomes, up to and including death. If you test positive for cocaine, your surgery will be cancelled.  On the morning of  surgery brush your teeth with toothpaste and water, you may rinse your mouth with mouthwash if you wish. Do not swallow any toothpaste or mouthwash.  Use CHG Soaps directed on instruction sheet.- provided for you  Do not wear jewelry, make-up, hairpins, clips or nail polish.  Do not wear lotions, powders, or perfumes.   Do not shave body hair from the neck down 48 hours before surgery.  Contact lenses, hearing aids and dentures may not be worn into surgery.  Do not bring valuables to the hospital. [facility] is not responsible for any missing/lost belongings or valuables.   Notify your doctor if there is any change in your medical condition (cold, fever, infection).  Wear comfortable clothing (specific to your surgery type) to the hospital.  After surgery, you can help prevent lung complications by doing breathing exercises.  Take deep breaths and cough every 1-2 hours. Your doctor may order a device called an Incentive Spirometer to help you take deep breaths. If you are being admitted to the hospital overnight, leave your suitcase in the car. After surgery it may be brought to your room.  In case of increased patient census, it may be necessary for you, the patient, to continue your postoperative care in the Same Day Surgery department.  If you are being discharged the day of surgery, you will not be allowed to drive home. You will need a responsible individual to drive you home and stay with you for 24 hours after surgery.  Please call the Pre-admissions Testing Dept. at [phone] if you have any questions about these instructions.  Surgery Visitation Policy:  Patients having surgery or a procedure may have two visitors.  Children under the age of 78 must have an adult with them who is not the patient.  Inpatient Visitation:    Visiting hours are 7 a.m. to 8 p.m. Up to four visitors are allowed at one time in a patient room. The visitors may rotate out with other  people during the day.  One visitor age 37 or older may stay with the patient overnight and must be in the room by 8 p.m.      Pre-operative 5 CHG Bath Instructions   You can play a key role in reducing the risk of infection after surgery. Your skin needs to be as free of germs as possible. You can reduce the number of germs on your skin by washing with CHG (chlorhexidine gluconate) soap before surgery. CHG is an antiseptic soap that kills germs and continues to kill germs even after washing.   DO NOT use if you have an allergy to chlorhexidine/CHG or antibacterial soaps. If your skin becomes reddened or irritated, stop using the CHG and notify one of our RNs at [phone].   Please shower with the CHG soap starting 4 days before surgery using the following schedule:     Please keep in mind the following:  DO NOT shave, including legs and underarms, starting the day of your first shower.   You may shave your face at any point before/day of surgery.  Place clean sheets on your bed the day you start using CHG soap. Use a clean washcloth (not used since being washed) for each shower. DO NOT sleep with pets once you start using the CHG.   CHG Shower Instructions:  If you choose to wash your hair and private area, wash first with your normal shampoo/soap.  After you use shampoo/soap, rinse your hair and body thoroughly to remove shampoo/soap residue.  Turn the water OFF and apply about 3 tablespoons (45 ml) of CHG soap to a CLEAN washcloth.  Apply CHG soap ONLY FROM YOUR NECK DOWN TO YOUR TOES (washing for 3-5 minutes)  DO NOT use CHG soap on face, private areas, open wounds, or sores.  Pay special attention to the area where your surgery is being performed.  If you are having back surgery, having someone wash your back for you may be helpful. Wait 2 minutes after CHG soap is applied, then you may rinse off the CHG soap.  Pat dry with a clean towel  Put on clean clothes/pajamas   If  you choose to wear lotion, please use ONLY the CHG-compatible lotions on the back of this paper.     Additional instructions for the day of surgery: DO NOT APPLY any lotions, deodorants, cologne, or perfumes.   Put on clean/comfortable clothes.  Brush your teeth.  Ask your nurse before applying any prescription medications to the skin.      CHG Compatible Lotions   Aveeno Moisturizing lotion  Cetaphil Moisturizing Cream  Cetaphil Moisturizing Lotion  Clairol Herbal Essence Moisturizing Lotion, Dry Skin  Clairol Herbal Essence Moisturizing Lotion, Extra Dry Skin  Clairol Herbal Essence Moisturizing Lotion, Normal Skin  Curel Age Defying Therapeutic Moisturizing Lotion with Alpha Hydroxy  Curel Extreme Care Body Lotion  Curel Soothing Hands Moisturizing Hand Lotion  Curel Therapeutic Moisturizing Cream, Fragrance-Free  Curel Therapeutic Moisturizing Lotion, Fragrance-Free  Curel Therapeutic  Moisturizing Lotion, Original Formula  Eucerin Daily Replenishing Lotion  Eucerin Dry Skin Therapy Plus Alpha Hydroxy Crme  Eucerin Dry Skin Therapy Plus Alpha Hydroxy Lotion  Eucerin Original Crme  Eucerin Original Lotion  Eucerin Plus Crme Eucerin Plus Lotion  Eucerin TriLipid Replenishing Lotion  Keri Anti-Bacterial Hand Lotion  Keri Deep Conditioning Original Lotion Dry Skin Formula Softly Scented  Keri Deep Conditioning Original Lotion, Fragrance Free Sensitive Skin Formula  Keri Lotion Fast Absorbing Fragrance Free Sensitive Skin Formula  Keri Lotion Fast Absorbing Softly Scented Dry Skin Formula  Keri Original Lotion  Keri Skin Renewal Lotion Keri Silky Smooth Lotion  Keri Silky Smooth Sensitive Skin Lotion  Nivea Body Creamy Conditioning Oil  Nivea Body Extra Enriched Teacher, adult education Moisturizing Lotion Nivea Crme  Nivea Skin Firming Lotion  NutraDerm 30 Skin Lotion  NutraDerm Skin Lotion  NutraDerm Therapeutic Skin Cream  NutraDerm  Therapeutic Skin Lotion  ProShield Protective Hand Cream  Provon moisturizing lotion

## 2022-09-21 LAB — URINE CULTURE: Culture: NO GROWTH

## 2022-09-23 ENCOUNTER — Encounter: Payer: Self-pay | Admitting: Neurosurgery

## 2022-10-01 ENCOUNTER — Telehealth: Payer: Self-pay

## 2022-10-01 NOTE — Telephone Encounter (Signed)
Mrs Argote called in reporting issues with constipation. Her last bowel movement was Friday. This is not a new issue for her. She is passing gas. She has tried prunes and a suppository twice, but has quickly passed the suppository without a bowel movement. Per discussion with Dr Myer Haff, I advised to her pick up a bottle of magnesium citrate as soon as possible. I also encouraged her to increase her water intake and ambulation.

## 2022-10-02 ENCOUNTER — Other Ambulatory Visit: Payer: Self-pay

## 2022-10-02 ENCOUNTER — Encounter
Admission: RE | Disposition: A | Discharge status: Home / Self Care | Payer: Self-pay | Source: Home / Self Care | Attending: Neurosurgery

## 2022-10-02 ENCOUNTER — Ambulatory Visit: Payer: PPO | Admitting: Urgent Care

## 2022-10-02 ENCOUNTER — Observation Stay
Admission: RE | Admit: 2022-10-02 | Discharge: 2022-10-03 | Disposition: A | Discharge status: Home / Self Care | Payer: PPO | Attending: Neurosurgery | Admitting: Neurosurgery

## 2022-10-02 ENCOUNTER — Encounter: Payer: Self-pay | Admitting: Neurosurgery

## 2022-10-02 ENCOUNTER — Ambulatory Visit: Payer: PPO

## 2022-10-02 DIAGNOSIS — I10 Essential (primary) hypertension: Secondary | ICD-10-CM | POA: Insufficient documentation

## 2022-10-02 DIAGNOSIS — M48062 Spinal stenosis, lumbar region with neurogenic claudication: Secondary | ICD-10-CM | POA: Diagnosis not present

## 2022-10-02 DIAGNOSIS — R8271 Bacteriuria: Principal | ICD-10-CM

## 2022-10-02 DIAGNOSIS — R350 Frequency of micturition: Secondary | ICD-10-CM

## 2022-10-02 DIAGNOSIS — Z01812 Encounter for preprocedural laboratory examination: Secondary | ICD-10-CM

## 2022-10-02 DIAGNOSIS — Z79899 Other long term (current) drug therapy: Secondary | ICD-10-CM | POA: Insufficient documentation

## 2022-10-02 DIAGNOSIS — Z9889 Other specified postprocedural states: Secondary | ICD-10-CM

## 2022-10-02 DIAGNOSIS — Z01818 Encounter for other preprocedural examination: Secondary | ICD-10-CM

## 2022-10-02 DIAGNOSIS — R8290 Unspecified abnormal findings in urine: Secondary | ICD-10-CM

## 2022-10-02 DIAGNOSIS — Z0189 Encounter for other specified special examinations: Secondary | ICD-10-CM | POA: Diagnosis not present

## 2022-10-02 HISTORY — PX: LUMBAR LAMINECTOMY/DECOMPRESSION MICRODISCECTOMY: SHX5026

## 2022-10-02 LAB — ABO/RH: ABO/RH(D): O POS

## 2022-10-02 SURGERY — LUMBAR LAMINECTOMY/DECOMPRESSION MICRODISCECTOMY 3 LEVELS
Anesthesia: General | Site: Spine Lumbar

## 2022-10-02 MED ORDER — ROCURONIUM BROMIDE 10 MG/ML (PF) SYRINGE
PREFILLED_SYRINGE | INTRAVENOUS | Status: AC
  Filled 2022-10-02: qty 10

## 2022-10-02 MED ORDER — SODIUM CHLORIDE 0.9 % IV SOLN: INTRAVENOUS | Status: DC

## 2022-10-02 MED ORDER — CEFAZOLIN SODIUM-DEXTROSE 2-4 GM/100ML-% IV SOLN
2.0000 g | Freq: Once | INTRAVENOUS | Status: AC
  Administered 2022-10-02: 2 g via INTRAVENOUS

## 2022-10-02 MED ORDER — LIDOCAINE HCL (PF) 2 % IJ SOLN
INTRAMUSCULAR | Status: AC
  Filled 2022-10-02: qty 5

## 2022-10-02 MED ORDER — 0.9 % SODIUM CHLORIDE (POUR BTL) OPTIME
TOPICAL | Status: DC | PRN
  Administered 2022-10-02: 500 mL

## 2022-10-02 MED ORDER — MORPHINE SULFATE (PF) 4 MG/ML IV SOLN: 1.0000 mg | INTRAVENOUS | Status: AC | PRN

## 2022-10-02 MED ORDER — LIDOCAINE HCL (CARDIAC) PF 100 MG/5ML IV SOSY
PREFILLED_SYRINGE | INTRAVENOUS | Status: DC | PRN
  Administered 2022-10-02: 100 mg via INTRAVENOUS

## 2022-10-02 MED ORDER — SODIUM CHLORIDE 0.9% FLUSH
3.0000 mL | Freq: Two times a day (BID) | INTRAVENOUS | Status: DC
  Administered 2022-10-02: 3 mL via INTRAVENOUS

## 2022-10-02 MED ORDER — BUPIVACAINE-EPINEPHRINE (PF) 0.5% -1:200000 IJ SOLN
INTRAMUSCULAR | Status: AC
  Filled 2022-10-02: qty 10

## 2022-10-02 MED ORDER — METHYLPREDNISOLONE ACETATE 40 MG/ML IJ SUSP
INTRAMUSCULAR | Status: DC | PRN
  Administered 2022-10-02: 40 mg

## 2022-10-02 MED ORDER — FENTANYL CITRATE (PF) 100 MCG/2ML IJ SOLN: 25.0000 ug | INTRAMUSCULAR | Status: DC | PRN

## 2022-10-02 MED ORDER — PROPOFOL 10 MG/ML IV BOLUS
INTRAVENOUS | Status: DC | PRN
  Administered 2022-10-02: 150 mg via INTRAVENOUS

## 2022-10-02 MED ORDER — PROPOFOL 10 MG/ML IV BOLUS
INTRAVENOUS | Status: AC
  Filled 2022-10-02: qty 20

## 2022-10-02 MED ORDER — AMLODIPINE BESYLATE 5 MG PO TABS
5.0000 mg | ORAL_TABLET | Freq: Every day | ORAL | Status: DC
  Administered 2022-10-03: 5 mg via ORAL

## 2022-10-02 MED ORDER — SODIUM CHLORIDE FLUSH 0.9 % IV SOLN
INTRAVENOUS | Status: AC
  Filled 2022-10-02: qty 20

## 2022-10-02 MED ORDER — SENNA 8.6 MG PO TABS
ORAL_TABLET | ORAL | Status: AC
  Filled 2022-10-02: qty 1

## 2022-10-02 MED ORDER — EPHEDRINE SULFATE (PRESSORS) 50 MG/ML IJ SOLN
INTRAMUSCULAR | Status: DC | PRN
  Administered 2022-10-02: 10 mg via INTRAVENOUS

## 2022-10-02 MED ORDER — SUCCINYLCHOLINE CHLORIDE 200 MG/10ML IV SOSY
PREFILLED_SYRINGE | INTRAVENOUS | Status: AC
  Filled 2022-10-02: qty 10

## 2022-10-02 MED ORDER — BISACODYL 10 MG RE SUPP: 10.0000 mg | Freq: Every day | RECTAL | Status: DC | PRN

## 2022-10-02 MED ORDER — ACETAMINOPHEN 10 MG/ML IV SOLN
INTRAVENOUS | Status: AC
  Filled 2022-10-02: qty 100

## 2022-10-02 MED ORDER — ACETAMINOPHEN 500 MG PO TABS: 1000.0000 mg | ORAL_TABLET | Freq: Four times a day (QID) | ORAL | Status: DC

## 2022-10-02 MED ORDER — DEXAMETHASONE SODIUM PHOSPHATE 10 MG/ML IJ SOLN
INTRAMUSCULAR | Status: AC
  Filled 2022-10-02: qty 1

## 2022-10-02 MED ORDER — LACTATED RINGERS IV SOLN: INTRAVENOUS | Status: DC

## 2022-10-02 MED ORDER — ONDANSETRON HCL 4 MG/2ML IJ SOLN
INTRAMUSCULAR | Status: AC
  Filled 2022-10-02: qty 2

## 2022-10-02 MED ORDER — SODIUM CHLORIDE 0.9 % IV SOLN: 250.0000 mL | INTRAVENOUS | Status: DC

## 2022-10-02 MED ORDER — PANTOPRAZOLE SODIUM 20 MG PO TBEC
20.0000 mg | DELAYED_RELEASE_TABLET | Freq: Every day | ORAL | Status: DC
  Administered 2022-10-02 – 2022-10-03 (×2): 20 mg via ORAL
  Filled 2022-10-02 (×2): qty 1

## 2022-10-02 MED ORDER — HYDROCHLOROTHIAZIDE 25 MG PO TABS
ORAL_TABLET | ORAL | Status: AC
  Filled 2022-10-02: qty 1

## 2022-10-02 MED ORDER — CEFAZOLIN SODIUM-DEXTROSE 2-4 GM/100ML-% IV SOLN
INTRAVENOUS | Status: AC
  Filled 2022-10-02: qty 100

## 2022-10-02 MED ORDER — OXYCODONE HCL 5 MG PO TABS: 10.0000 mg | ORAL_TABLET | ORAL | Status: DC | PRN

## 2022-10-02 MED ORDER — ONDANSETRON HCL 4 MG/2ML IJ SOLN
INTRAMUSCULAR | Status: DC | PRN
  Administered 2022-10-02: 4 mg via INTRAVENOUS

## 2022-10-02 MED ORDER — DOCUSATE SODIUM 100 MG PO CAPS
ORAL_CAPSULE | ORAL | Status: AC
  Filled 2022-10-02: qty 1

## 2022-10-02 MED ORDER — ORAL CARE MOUTH RINSE: 15.0000 mL | Freq: Once | OROMUCOSAL | Status: AC

## 2022-10-02 MED ORDER — METHOCARBAMOL 500 MG PO TABS: 500.0000 mg | ORAL_TABLET | Freq: Four times a day (QID) | ORAL | Status: DC | PRN

## 2022-10-02 MED ORDER — METHOCARBAMOL 1000 MG/10ML IJ SOLN: 500.0000 mg | Freq: Four times a day (QID) | INTRAVENOUS | Status: DC | PRN

## 2022-10-02 MED ORDER — PHENYLEPHRINE HCL-NACL 20-0.9 MG/250ML-% IV SOLN
INTRAVENOUS | Status: DC | PRN
  Administered 2022-10-02: 40 ug/min via INTRAVENOUS

## 2022-10-02 MED ORDER — KETOROLAC TROMETHAMINE 15 MG/ML IJ SOLN
INTRAMUSCULAR | Status: AC
  Filled 2022-10-02: qty 1

## 2022-10-02 MED ORDER — ACETAMINOPHEN 500 MG PO TABS
ORAL_TABLET | ORAL | Status: AC
  Filled 2022-10-02: qty 2

## 2022-10-02 MED ORDER — OXYCODONE HCL 5 MG PO TABS: 5.0000 mg | ORAL_TABLET | Freq: Once | ORAL | Status: DC | PRN

## 2022-10-02 MED ORDER — ACETAMINOPHEN 500 MG PO TABS
1000.0000 mg | ORAL_TABLET | Freq: Four times a day (QID) | ORAL | Status: AC
  Administered 2022-10-02 – 2022-10-03 (×4): 1000 mg via ORAL

## 2022-10-02 MED ORDER — DIPHENHYDRAMINE HCL 25 MG PO CAPS: 25.0000 mg | ORAL_CAPSULE | Freq: Every evening | ORAL | Status: DC | PRN

## 2022-10-02 MED ORDER — DEXAMETHASONE SODIUM PHOSPHATE 10 MG/ML IJ SOLN
INTRAMUSCULAR | Status: DC | PRN
  Administered 2022-10-02: 10 mg via INTRAVENOUS

## 2022-10-02 MED ORDER — POLYETHYLENE GLYCOL 3350 17 G PO PACK: 17.0000 g | PACK | Freq: Every day | ORAL | Status: DC | PRN

## 2022-10-02 MED ORDER — OXYCODONE HCL 5 MG PO TABS: 5.0000 mg | ORAL_TABLET | ORAL | Status: DC | PRN

## 2022-10-02 MED ORDER — FENTANYL CITRATE (PF) 100 MCG/2ML IJ SOLN
INTRAMUSCULAR | Status: AC
  Filled 2022-10-02: qty 2

## 2022-10-02 MED ORDER — SURGIFLO WITH THROMBIN (HEMOSTATIC MATRIX KIT) OPTIME
TOPICAL | Status: DC | PRN
  Administered 2022-10-02: 1 via TOPICAL

## 2022-10-02 MED ORDER — BUPIVACAINE HCL (PF) 0.5 % IJ SOLN
INTRAMUSCULAR | Status: AC
  Filled 2022-10-02: qty 30

## 2022-10-02 MED ORDER — SODIUM CHLORIDE (PF) 0.9 % IJ SOLN
INTRAMUSCULAR | Status: DC | PRN
  Administered 2022-10-02: 60 mL via INTRAMUSCULAR

## 2022-10-02 MED ORDER — PHENOL 1.4 % MT LIQD: 1.0000 | OROMUCOSAL | Status: DC | PRN

## 2022-10-02 MED ORDER — MENTHOL 3 MG MT LOZG: 1.0000 | LOZENGE | OROMUCOSAL | Status: DC | PRN

## 2022-10-02 MED ORDER — REMIFENTANIL HCL 1 MG IV SOLR
INTRAVENOUS | Status: DC | PRN
  Administered 2022-10-02: .05 ug/kg/min via INTRAVENOUS

## 2022-10-02 MED ORDER — SENNA 8.6 MG PO TABS
1.0000 | ORAL_TABLET | Freq: Two times a day (BID) | ORAL | Status: DC
  Administered 2022-10-03: 8.6 mg via ORAL

## 2022-10-02 MED ORDER — OXYCODONE HCL 5 MG/5ML PO SOLN: 5.0000 mg | Freq: Once | ORAL | Status: DC | PRN

## 2022-10-02 MED ORDER — BUPIVACAINE-EPINEPHRINE (PF) 0.5% -1:200000 IJ SOLN
INTRAMUSCULAR | Status: DC | PRN
  Administered 2022-10-02: 10 mL

## 2022-10-02 MED ORDER — ONDANSETRON HCL 4 MG/2ML IJ SOLN: 4.0000 mg | Freq: Four times a day (QID) | INTRAMUSCULAR | Status: DC | PRN

## 2022-10-02 MED ORDER — PHENYLEPHRINE HCL (PRESSORS) 10 MG/ML IV SOLN
INTRAVENOUS | Status: DC | PRN
  Administered 2022-10-02: 160 ug via INTRAVENOUS

## 2022-10-02 MED ORDER — KETOROLAC TROMETHAMINE 30 MG/ML IJ SOLN
INTRAMUSCULAR | Status: DC | PRN
  Administered 2022-10-02: 30 mg via INTRAVENOUS

## 2022-10-02 MED ORDER — MAGNESIUM CITRATE PO SOLN: 1.0000 | Freq: Once | ORAL | Status: DC | PRN

## 2022-10-02 MED ORDER — ONDANSETRON HCL 4 MG PO TABS: 4.0000 mg | ORAL_TABLET | Freq: Four times a day (QID) | ORAL | Status: DC | PRN

## 2022-10-02 MED ORDER — ACETAMINOPHEN 10 MG/ML IV SOLN
INTRAVENOUS | Status: DC | PRN
  Administered 2022-10-02: 1000 mg via INTRAVENOUS

## 2022-10-02 MED ORDER — HYDROCHLOROTHIAZIDE 12.5 MG PO TABS
12.5000 mg | ORAL_TABLET | Freq: Every day | ORAL | Status: DC
  Administered 2022-10-02 – 2022-10-03 (×2): 12.5 mg via ORAL
  Filled 2022-10-02 (×2): qty 1

## 2022-10-02 MED ORDER — REMIFENTANIL HCL 1 MG IV SOLR
INTRAVENOUS | Status: AC
  Filled 2022-10-02: qty 1000

## 2022-10-02 MED ORDER — ENOXAPARIN SODIUM 40 MG/0.4ML IJ SOSY: 40.0000 mg | PREFILLED_SYRINGE | INTRAMUSCULAR | Status: DC

## 2022-10-02 MED ORDER — DOCUSATE SODIUM 100 MG PO CAPS
100.0000 mg | ORAL_CAPSULE | Freq: Two times a day (BID) | ORAL | Status: DC
  Administered 2022-10-03: 100 mg via ORAL

## 2022-10-02 MED ORDER — KETOROLAC TROMETHAMINE 15 MG/ML IJ SOLN
7.5000 mg | Freq: Four times a day (QID) | INTRAMUSCULAR | Status: AC
  Administered 2022-10-02 – 2022-10-03 (×4): 7.5 mg via INTRAVENOUS

## 2022-10-02 MED ORDER — CHLORHEXIDINE GLUCONATE 0.12 % MT SOLN
OROMUCOSAL | Status: AC
  Filled 2022-10-02: qty 15

## 2022-10-02 MED ORDER — SODIUM CHLORIDE 0.9% FLUSH: 3.0000 mL | INTRAVENOUS | Status: DC | PRN

## 2022-10-02 MED ORDER — ACETAMINOPHEN 10 MG/ML IV SOLN: 1000.0000 mg | Freq: Once | INTRAVENOUS | Status: DC | PRN

## 2022-10-02 MED ORDER — CHLORHEXIDINE GLUCONATE 0.12 % MT SOLN
15.0000 mL | Freq: Once | OROMUCOSAL | Status: AC
  Administered 2022-10-02: 15 mL via OROMUCOSAL

## 2022-10-02 MED ORDER — MIDAZOLAM HCL 5 MG/5ML IJ SOLN
INTRAMUSCULAR | Status: DC | PRN
  Administered 2022-10-02: 1 mg via INTRAVENOUS

## 2022-10-02 MED ORDER — BUPIVACAINE LIPOSOME 1.3 % IJ SUSP
INTRAMUSCULAR | Status: AC
  Filled 2022-10-02: qty 20

## 2022-10-02 MED ORDER — FENTANYL CITRATE (PF) 100 MCG/2ML IJ SOLN
INTRAMUSCULAR | Status: DC | PRN
  Administered 2022-10-02: 25 ug via INTRAVENOUS
  Administered 2022-10-02: 50 ug via INTRAVENOUS
  Administered 2022-10-02: 25 ug via INTRAVENOUS

## 2022-10-02 MED ORDER — SUCCINYLCHOLINE CHLORIDE 200 MG/10ML IV SOSY
PREFILLED_SYRINGE | INTRAVENOUS | Status: DC | PRN
  Administered 2022-10-02: 100 mg via INTRAVENOUS

## 2022-10-02 MED ORDER — METHYLPREDNISOLONE ACETATE 40 MG/ML IJ SUSP
INTRAMUSCULAR | Status: AC
  Filled 2022-10-02: qty 1

## 2022-10-02 MED ORDER — ONDANSETRON HCL 4 MG/2ML IJ SOLN: 4.0000 mg | Freq: Once | INTRAMUSCULAR | Status: DC | PRN

## 2022-10-02 MED ORDER — MIDAZOLAM HCL 2 MG/2ML IJ SOLN
INTRAMUSCULAR | Status: AC
  Filled 2022-10-02: qty 2

## 2022-10-02 SURGICAL SUPPLY — 44 items
ADH SKN CLS APL DERMABOND .7 (GAUZE/BANDAGES/DRESSINGS) ×1
AGENT HMST KT MTR STRL THRMB (HEMOSTASIS) ×1
BASIN KIT SINGLE STR (MISCELLANEOUS) ×1 IMPLANT
BUR NEURO DRILL SOFT 3.0X3.8M (BURR) ×1 IMPLANT
CNTNR URN SCR LID CUP LEK RST (MISCELLANEOUS) ×1 IMPLANT
CONT SPEC 4OZ STRL OR WHT (MISCELLANEOUS)
DERMABOND ADVANCED .7 DNX12 (GAUZE/BANDAGES/DRESSINGS) ×1 IMPLANT
DRAPE C ARM PK CFD 31 SPINE (DRAPES) ×1 IMPLANT
DRAPE LAPAROTOMY 100X77 ABD (DRAPES) ×1 IMPLANT
DRAPE MICROSCOPE SPINE 48X150 (DRAPES) IMPLANT
DRSG OPSITE POSTOP 4X6 (GAUZE/BANDAGES/DRESSINGS) IMPLANT
ELECT EZSTD 165MM 6.5IN (MISCELLANEOUS) ×1
ELECT REM PT RETURN 9FT ADLT (ELECTROSURGICAL) ×1
ELECTRODE EZSTD 165MM 6.5IN (MISCELLANEOUS) ×1 IMPLANT
ELECTRODE REM PT RTRN 9FT ADLT (ELECTROSURGICAL) ×1 IMPLANT
GLOVE BIOGEL PI IND STRL 6.5 (GLOVE) ×1 IMPLANT
GLOVE SURG SYN 6.5 ES PF (GLOVE) ×1 IMPLANT
GLOVE SURG SYN 6.5 PF PI (GLOVE) ×1 IMPLANT
GLOVE SURG SYN 8.5 E (GLOVE) ×3 IMPLANT
GLOVE SURG SYN 8.5 PF PI (GLOVE) ×3 IMPLANT
GOWN SRG LRG LVL 4 IMPRV REINF (GOWNS) ×1 IMPLANT
GOWN SRG XL LVL 3 NONREINFORCE (GOWNS) ×1 IMPLANT
GOWN STRL NON-REIN TWL XL LVL3 (GOWNS) ×1
GOWN STRL REIN LRG LVL4 (GOWNS) ×1
GRAFT DURAGEN MATRIX 1WX1L (Tissue) IMPLANT
HEMOVAC 400CC 10FR (MISCELLANEOUS) IMPLANT
KIT SPINAL PRONEVIEW (KITS) ×1 IMPLANT
MANIFOLD NEPTUNE II (INSTRUMENTS) ×1 IMPLANT
MARKER SKIN DUAL TIP RULER LAB (MISCELLANEOUS) ×1 IMPLANT
NDL SAFETY ECLIP 18X1.5 (MISCELLANEOUS) ×1 IMPLANT
NS IRRIG 1000ML POUR BTL (IV SOLUTION) ×1 IMPLANT
NS IRRIG 500ML POUR BTL (IV SOLUTION) IMPLANT
PACK LAMINECTOMY ARMC (PACKS) ×1 IMPLANT
SURGIFLO W/THROMBIN 8M KIT (HEMOSTASIS) ×1 IMPLANT
SUT DVC VLOC 3-0 CL 6 P-12 (SUTURE) ×1 IMPLANT
SUT ETHILON 3-0 FS-10 30 BLK (SUTURE) ×1
SUT VIC AB 0 CT1 27 (SUTURE) ×1
SUT VIC AB 0 CT1 27XCR 8 STRN (SUTURE) ×1 IMPLANT
SUT VIC AB 2-0 CT1 18 (SUTURE) ×1 IMPLANT
SUTURE EHLN 3-0 FS-10 30 BLK (SUTURE) IMPLANT
SYR 30ML LL (SYRINGE) ×2 IMPLANT
SYR 3ML LL SCALE MARK (SYRINGE) ×1 IMPLANT
TRAP FLUID SMOKE EVACUATOR (MISCELLANEOUS) ×1 IMPLANT
WATER STERILE IRR 1000ML POUR (IV SOLUTION) ×2 IMPLANT

## 2022-10-02 NOTE — Op Note (Signed)
Indications: 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 The patient tried and failed conservative management, prompting surgical intervention.  Findings: lumbar stenosis  Preoperative Diagnosis: Lumbar Stenosis with neurogenic claudication Postoperative Diagnosis: same   EBL: 25 ml IVF:see anesthesia record Drains: 1 Disposition: Extubated and Stable to PACU Complications: none  No foley catheter was placed.   Preoperative Note:  Risks of surgery discussed include: infection, bleeding, stroke, coma, death, paralysis, CSF leak, nerve/spinal cord injury, numbness, tingling, weakness, complex regional pain syndrome, recurrent stenosis and/or disc herniation, vascular injury, development of instability, neck/back pain, need for further surgery, persistent symptoms, development of deformity, and the risks of anesthesia. The patient understood these risks and agreed to proceed.  Operative Note:   1. L3-S1 lumbar decompression including central laminectomy and bilateral medial facetectomies including foraminotomies  The patient was then brought from the preoperative center with intravenous access established.  The patient underwent general anesthesia and endotracheal tube intubation, and was then rotated on the [place] rail top where all pressure points were appropriately padded.  The skin was then thoroughly cleansed.  Perioperative antibiotic prophylaxis was administered.  Sterile prep and drapes were then applied and a timeout was then observed.  C-arm was brought into the field under sterile conditions and under lateral visualization the L3-S1 vertebral bodies were identified and marked.  The incision was marked on the left and injected with local anesthetic. Once this was complete a 6 cm incision was opened with the use of a #10 blade knife.    The metrx tubes were sequentially advanced and confirmed in position at L5-S1. An 18mm by  70mm tube was locked in place to the bed side attachment.  The microscope was then sterilely brought into the field and muscle creep was hemostased with a bipolar and resected with a pituitary rongeur.  A Bovie extender was then used to expose the spinous process and lamina.  Careful attention was placed to not violate the facet capsule. A 3 mm matchstick drill bit was then used to make a hemi-laminotomy trough until the ligamentum flavum was exposed.  This was extended to the base of the spinous process and to the contralateral side to remove all the central bone from each side.  Once this was complete and the underlying ligamentum flavum was visualized, it was dissected with a curette and resected with Kerrison rongeurs.  Extensive ligamentum hypertrophy was noted, requiring a substantial amount of time and care for removal.  The dura was identified and palpated. The kerrison rongeur was then used to remove the medial facet bilaterally until no compression was noted.  A balltip probe was used to confirm decompression of the ipsilateral S1 nerve root.  Additional attention was paid to completion of the contralateral L5-S1 foraminotomy until the contralateral traversing nerve root was completely free.  Once this was complete, L5-S1 central decompression including medial facetectomy and foraminotomy was confirmed and decompression on both sides was confirmed. No CSF leak was noted.  Depo-Medrol was placed on the nerve root.  The wound was copiously irrigated. The tube system was then removed under microscopic visualization and hemostasis was obtained with a bipolar.    After performing the decompression at L4-5, the metrx tubes were sequentially advanced and confirmed in position at L4-5. An 18mm by 70mm tube was locked in place to the bed side attachment.  Fluoroscopy was then removed from the field.  The microscope was then sterilely brought into the field and muscle creep  was hemostased with a bipolar and  resected with a pituitary rongeur.  A Bovie extender was then used to expose the spinous process and lamina.  Careful attention was placed to not violate the facet capsule. A 3 mm matchstick drill bit was then used to make a hemi-laminotomy trough until the ligamentum flavum was exposed.  This was extended to the base of the spinous process and to the contralateral side to remove all the central bone from each side.  Once this was complete and the underlying ligamentum flavum was visualized, it was dissected with a curette and resected with Kerrison rongeurs.  Extensive ligamentum hypertrophy was noted, requiring a substantial amount of time and care for removal.  The dura was identified and palpated. The kerrison rongeur was then used to remove the medial facet bilaterally until no compression was noted.  A balltip probe was used to confirm decompression of the ipsilateral L5 nerve root.  Additional attention was paid to completion of the contralateral foraminotomy until the contralateral L5 nerve root was completely free.  Once this was complete, L4-5 central decompression including medial facetectomy and foraminotomy was confirmed and decompression on both sides was confirmed. No CSF leak was noted.  Depo-Medrol was placed on the nerve root.   The wound was copiously irrigated. The tube system was then removed under microscopic visualization and hemostasis was obtained with a bipolar.    After performing the decompression at L4-5, the metrx tubes were sequentially advanced and confirmed in position at L3-4. An 18mm by 70mm tube was locked in place to the bed side attachment.  Fluoroscopy was then removed from the field.  The microscope was then sterilely brought into the field and muscle creep was hemostased with a bipolar and resected with a pituitary rongeur.  A Bovie extender was then used to expose the spinous process and lamina.  Careful attention was placed to not violate the facet capsule. A 3 mm  matchstick drill bit was then used to make a hemi-laminotomy trough until the ligamentum flavum was exposed.  This was extended to the base of the spinous process and to the contralateral side to remove all the central bone from each side.  Once this was complete and the underlying ligamentum flavum was visualized, it was dissected with a curette and resected with Kerrison rongeurs.  Extensive ligamentum hypertrophy was noted, requiring a substantial amount of time and care for removal.  The dura was identified and palpated. The kerrison rongeur was then used to remove the medial facet bilaterally until no compression was noted.  A balltip probe was used to confirm decompression of the ipsilateral L4 nerve root.  Additional attention was paid to completion of the contralateral foraminotomy until the contralateral L4 nerve root was completely free.  Once this was complete, L3-4 central decompression including medial facetectomy and foraminotomy was confirmed and decompression on both sides was confirmed. No CSF leak was noted.  Depo-Medrol was placed on the nerve root.   The wound was copiously irrigated. The tube system was then removed under microscopic visualization and hemostasis was obtained with a bipolar.    A drain was placed.    The fascial layer was reapproximated with the use of a 0 Vicryl suture.  Subcutaneous tissue layer was reapproximated using 2-0 Vicryl suture.  3-0 monocryl was placed in subcuticular fashion. The skin was then cleansed and Dermabond was used to close the skin opening.  Patient was then rotated back to the preoperative bed awakened from anesthesia  and taken to recovery all counts are correct in this case.  I performed the entire procedure with the assistance of Manning Charity PA as an Designer, television/film set. An assistant was required for this procedure due to the complexity.  The assistant provided assistance in tissue manipulation and suction, and was required for the  successful and safe performance of the procedure. I performed the critical portions of the procedure.   Gaylia Kassel K. Myer Haff MD

## 2022-10-02 NOTE — Anesthesia Preprocedure Evaluation (Addendum)
Anesthesia Evaluation  Patient identified by MRN, date of birth, ID band Patient awake    Reviewed: Allergy & Precautions, NPO status , Patient's Chart, lab work & pertinent test results  History of Anesthesia Complications Negative for: history of anesthetic complications  Airway Mallampati: I   Neck ROM: Full    Dental  (+) Lower Dentures   Pulmonary neg pulmonary ROS   Pulmonary exam normal breath sounds clear to auscultation       Cardiovascular hypertension, Normal cardiovascular exam Rhythm:Regular Rate:Normal  ECG 09/20/22: Normal sinus rhythm Right bundle branch block Left anterior fascicular block Bifascicular block   Neuro/Psych negative neurological ROS     GI/Hepatic ,GERD  ,,  Endo/Other  Obesity   Renal/GU negative Renal ROS     Musculoskeletal  (+) Arthritis ,    Abdominal   Peds  Hematology  (+) Blood dyscrasia, anemia   Anesthesia Other Findings   Reproductive/Obstetrics                             Anesthesia Physical Anesthesia Plan  ASA: 2  Anesthesia Plan: General   Post-op Pain Management:    Induction: Intravenous  PONV Risk Score and Plan: 3 and Ondansetron, Dexamethasone and Treatment may vary due to age or medical condition  Airway Management Planned: Oral ETT  Additional Equipment:   Intra-op Plan:   Post-operative Plan: Extubation in OR  Informed Consent: I have reviewed the patients History and Physical, chart, labs and discussed the procedure including the risks, benefits and alternatives for the proposed anesthesia with the patient or authorized representative who has indicated his/her understanding and acceptance.     Dental advisory given  Plan Discussed with: CRNA  Anesthesia Plan Comments: (Patient consented for risks of anesthesia including but not limited to:  - adverse reactions to medications - damage to eyes, teeth, lips or  other oral mucosa - nerve damage due to positioning  - sore throat or hoarseness - damage to heart, brain, nerves, lungs, other parts of body or loss of life  Informed patient about role of CRNA in peri- and intra-operative care.  Patient voiced understanding.)        Anesthesia Quick Evaluation

## 2022-10-02 NOTE — Transfer of Care (Signed)
Immediate Anesthesia Transfer of Care Note  Patient: Jean Khan  Procedure(s) Performed: L3-S1 DECOMPRESSION (Spine Lumbar)  Patient Location: PACU  Anesthesia Type:General  Level of Consciousness: awake and drowsy  Airway & Oxygen Therapy: Patient Spontanous Breathing and Patient connected to face mask oxygen  Post-op Assessment: Report given to RN and Post -op Vital signs reviewed and stable  Post vital signs: Reviewed and stable  Last Vitals:  Vitals Value Taken Time  BP 117/56 10/02/22 1004  Temp    Pulse 77 10/02/22 1006  Resp 19 10/02/22 1006  SpO2 98 % 10/02/22 1006  Vitals shown include unvalidated device data.  Last Pain:  Vitals:   10/02/22 0615  TempSrc: Temporal  PainSc: 0-No pain         Complications: No notable events documented.

## 2022-10-02 NOTE — Discharge Instructions (Signed)
Your surgeon has performed an operation on your lumbar spine (low back) to relieve pressure on one or more nerves. Many times, patients feel better immediately after surgery and can "overdo it." Even if you feel well, it is important that you follow these activity guidelines. If you do not let your back heal properly from the surgery, you can increase the chance of a disc herniation and/or return of your symptoms. The following are instructions to help in your recovery once you have been discharged from the hospital.  * It is ok to take NSAIDs after surgery.  Activity    No bending, lifting, or twisting ("BLT"). Avoid lifting objects heavier than 10 pounds (gallon milk jug).  Where possible, avoid household activities that involve lifting, bending, pushing, or pulling such as laundry, vacuuming, grocery shopping, and childcare. Try to arrange for help from friends and family for these activities while your back heals.  Increase physical activity slowly as tolerated.  Taking short walks is encouraged, but avoid strenuous exercise. Do not jog, run, bicycle, lift weights, or participate in any other exercises unless specifically allowed by your doctor. Avoid prolonged sitting, including car rides.  Talk to your doctor before resuming sexual activity.  You should not drive until cleared by your doctor.  Until released by your doctor, you should not return to work or school.  You should rest at home and let your body heal.   You may shower three days after your surgery.  After showering, lightly dab your incision dry. Do not take a tub bath or go swimming for 3 weeks, or until approved by your doctor at your follow-up appointment.  If you smoke, we strongly recommend that you quit.  Smoking has been proven to interfere with normal healing in your back and will dramatically reduce the success rate of your surgery. Please contact [facility] (800-QUIT-NOW) and use the resources at www.[facility].com for  assistance in stopping smoking.  Surgical Incision   If you have a dressing on your incision, you may remove it three days after your surgery. Keep your incision area clean and dry.  If you have staples or stitches on your incision, you should have a follow up scheduled for removal. If you do not have staples or stitches, you will have steri-strips (small pieces of surgical tape) or Dermabond glue. The steri-strips/glue should begin to peel away within about a week (it is fine if the steri-strips fall off before then). If the strips are still in place one week after your surgery, you may gently remove them.  Diet            You may return to your usual diet. Be sure to stay hydrated.  When to Contact Us  Although your surgery and recovery will likely be uneventful, you may have some residual numbness, aches, and pains in your back and/or legs. This is normal and should improve in the next few weeks.  However, should you experience any of the following, contact us immediately: New numbness or weakness Pain that is progressively getting worse, and is not relieved by your pain medications or rest Bleeding, redness, swelling, pain, or drainage from surgical incision Chills or flu-like symptoms Fever greater than 101.0 F (38.3 C) Problems with bowel or bladder functions Difficulty breathing or shortness of breath Warmth, tenderness, or swelling in your calf  Contact Information How to contact us:  If you have any questions/concerns before or after surgery, you can reach us at [phone], or you can   send a mychart message. We can be reached by phone or mychart 8am-4pm, Monday-Friday.  *Please note: Calls after 4pm are forwarded to a third party answering service. Mychart messages are not routinely monitored during evenings, weekends, and holidays. Please call our office to contact the answering service for urgent concerns during non-business hours.  

## 2022-10-02 NOTE — Evaluation (Signed)
Physical Therapy Evaluation Patient Details Name: Jean Khan MRN: [id number] DOB: Nov 12, 1945 Today's Date: 10/02/2022  History of Present Illness  Pt is a 77 y/o F admitted on 10/02/22 for scheduled L3-S1 decompression. PMH: arthritis, HTN  Clinical Impression  Pt seen for PT evaluation with pt agreeable to tx. Pt reports prior to admission she was ambulatory with PRN use of rollator. On this date, PT educates pt on back precautions with pt able to recall them at end of session. Pt completes log rolling with supervision, STS with min assist fade to CGA, & ambulates with RW & CGA. Educated pt & family on recommendation to use RW vs rollator at d/c. Will plan to see pt tomorrow for stair negotiation practice.     Assistance Recommended at Discharge Intermittent Supervision/Assistance  If plan is discharge home, recommend the following:  Can travel by private vehicle  A little help with walking and/or transfers;A little help with bathing/dressing/bathroom;Assistance with cooking/housework;Assist for transportation;Help with stairs or ramp for entrance        Equipment Recommendations None recommended by PT (pt reports she has a RW & BSC already)  Recommendations for Other Services       Functional Status Assessment Patient has had a recent decline in their functional status and demonstrates the ability to make significant improvements in function in a reasonable and predictable amount of time.     Precautions / Restrictions Precautions Precautions: Fall;Back Restrictions Weight Bearing Restrictions: No      Mobility  Bed Mobility Overal bed mobility: Needs Assistance Bed Mobility: Rolling, Sidelying to Sit Rolling: Supervision Sidelying to sit: Supervision, HOB elevated       General bed mobility comments: education re: log rolling, pt able to complete with supervision with use of bed rails, HOB partially elevated    Transfers Overall transfer level: Needs  assistance Equipment used: Rolling walker (2 wheels) Transfers: Sit to/from Stand Sit to Stand: Min assist           General transfer comment: STS from EOB with min assist, fade to CGA for STS from toilet with use of grab bar    Ambulation/Gait Ambulation/Gait assistance: Min guard Gait Distance (Feet): 30 Feet (+ 30 ft) Assistive device: Rolling walker (2 wheels) Gait Pattern/deviations: Step-to pattern, Decreased step length - right, Decreased step length - left, Decreased stride length, Decreased stance time - left Gait velocity: decreased     General Gait Details: Pt with decreased step length RLE 2/2 decreased weight shifting to LLE 2/2 chronic L knee pain. BLE step length does improve as gait progresses.  Stairs            Wheelchair Mobility     Tilt Bed    [name] (Stroke Patients Only)       Balance Overall balance assessment: Needs assistance Sitting-balance support: Feet supported Sitting balance-[name]: Good     Standing balance support: During functional activity, Bilateral upper extremity supported, Reliant on assistive device for balance Standing balance-[name]: Fair                               Pertinent Vitals/Pain Pain Assessment Pain Assessment: Faces Faces Pain Scale: Hurts a little bit Pain Location: back Pain Descriptors / Indicators: Discomfort Pain Intervention(s): Monitored during session    Home Living Family/patient expects to be discharged to:: Private residence Living Arrangements: Spouse/significant other Available Help at Discharge: Family (husband works but planning to  provide 24 hr assistance to pt upon d/c.) Type of Home: House Home Access: Stairs to enter Entrance Stairs-Rails: Left Entrance Stairs-Number of Steps: 3   Home Layout: One level Home Equipment: Agricultural consultant (2 wheels);Rollator (4 wheels);BSC/3in1      Prior Function Prior Level of Function : Independent/Modified  Independent;Driving             Mobility Comments: Pt was ambulatory with PRN use of rollator 2/2 L knee pain/weakness. Pt still driving, denies falls, anticipating L TKA in the future.       Hand Dominance        Extremity/Trunk Assessment   Upper Extremity Assessment Upper Extremity Assessment: Overall WFL for tasks assessed    Lower Extremity Assessment Lower Extremity Assessment: LLE deficits/detail LLE Deficits / Details: pt anticipating L TKA in the future    Cervical / Trunk Assessment Cervical / Trunk Assessment: Normal  Communication   Communication: No difficulties  Cognition Arousal/Alertness: Awake/alert Behavior During Therapy: WFL for tasks assessed/performed Overall Cognitive Status: Within Functional Limits for tasks assessed                                          General Comments General comments (skin integrity, edema, etc.): Pt with continent void on toilet, performs hand hygiene standing at sink with CGA.    Exercises     Assessment/Plan    PT Assessment Patient needs continued PT services  PT Problem List Decreased strength;Pain;Decreased balance;Decreased mobility;Decreased knowledge of precautions;Decreased safety awareness;Decreased activity tolerance;Decreased knowledge of use of DME       PT Treatment Interventions Therapeutic exercise;DME instruction;Balance training;Gait training;Stair training;Functional mobility training;Therapeutic activities;Patient/family education;Neuromuscular re-education;Modalities    PT Goals (Current goals can be found in the Care Plan section)  Acute Rehab PT Goals Patient Stated Goal: get better, go home PT Goal Formulation: With patient Time For Goal Achievement: 10/16/22 Potential to Achieve Goals: Good    Frequency Min 4X/week     Co-evaluation               AM-PAC PT "6 Clicks" Mobility  Outcome Measure Help needed turning from your back to your side while in a flat  bed without using bedrails?: None Help needed moving from lying on your back to sitting on the side of a flat bed without using bedrails?: A Little Help needed moving to and from a bed to a chair (including a wheelchair)?: A Little Help needed standing up from a chair using your arms (e.g., wheelchair or bedside chair)?: A Little Help needed to walk in hospital room?: A Little Help needed climbing 3-5 steps with a railing? : A Little 6 Click Score: 19    End of Session Equipment Utilized During Treatment: Gait belt Activity Tolerance: Patient tolerated treatment well Patient left: in chair;with call bell/phone within reach;with family/visitor present Nurse Communication: Mobility status PT Visit Diagnosis: Difficulty in walking, not elsewhere classified (R26.2);Muscle weakness (generalized) (M62.81);Pain Pain - part of body:  (back)    Time: 1610-9604 PT Time Calculation (min) (ACUTE ONLY): 17 min   Charges:   PT Evaluation $PT Eval Low Complexity: 1 Low   PT General Charges $$ ACUTE PT VISIT: 1 Visit         Aleda Grana, PT, DPT 10/02/22, 3:59 PM   Sandi Mariscal 10/02/2022, 3:58 PM

## 2022-10-02 NOTE — Interval H&P Note (Signed)
History and Physical Interval Note:  10/02/2022 6:58 AM  Jean Khan  has presented today for surgery, with the diagnosis of M48.062 Neurogenic claudication due to lumbar spinal stenosis.  The various methods of treatment have been discussed with the patient and family. After consideration of risks, benefits and other options for treatment, the patient has consented to  Procedure(s): L3-S1 DECOMPRESSION (N/A) as a surgical intervention.  The patient's history has been reviewed, patient examined, no change in status, stable for surgery.  I have reviewed the patient's chart and labs.  Questions were answered to the patient's satisfaction.    Heart sounds normal no MRG. Chest Clear to Auscultation Bilaterally.    Jean Khan

## 2022-10-02 NOTE — Anesthesia Procedure Notes (Signed)
Procedure Name: Intubation Date/Time: 10/02/2022 7:21 AM  Performed by: Elmarie Mainland, CRNAPre-anesthesia Checklist: Patient identified, Emergency Drugs available, Suction available and Patient being monitored Patient Re-evaluated:Patient Re-evaluated prior to induction Oxygen Delivery Method: Circle system utilized Preoxygenation: Pre-oxygenation with 100% oxygen Induction Type: IV induction Ventilation: Mask ventilation without difficulty Laryngoscope Size: McGraph, Mac and 3 Grade View: Grade I Tube type: Oral Number of attempts: 1 Airway Equipment and Method: Stylet and Oral airway Placement Confirmation: ETT inserted through vocal cords under direct vision, positive ETCO2 and breath sounds checked- equal and bilateral Secured at: 22 cm Tube secured with: Tape Dental Injury: Teeth and Oropharynx as per pre-operative assessment

## 2022-10-03 DIAGNOSIS — M48062 Spinal stenosis, lumbar region with neurogenic claudication: Secondary | ICD-10-CM | POA: Diagnosis not present

## 2022-10-03 MED ORDER — PANTOPRAZOLE SODIUM 40 MG PO TBEC
DELAYED_RELEASE_TABLET | ORAL | Status: AC
  Filled 2022-10-03: qty 1

## 2022-10-03 MED ORDER — AMLODIPINE BESYLATE 5 MG PO TABS
ORAL_TABLET | ORAL | Status: AC
  Filled 2022-10-03: qty 1

## 2022-10-03 MED ORDER — ENOXAPARIN SODIUM 40 MG/0.4ML IJ SOSY
PREFILLED_SYRINGE | INTRAMUSCULAR | Status: AC
  Filled 2022-10-03: qty 0.4

## 2022-10-03 MED ORDER — KETOROLAC TROMETHAMINE 15 MG/ML IJ SOLN
INTRAMUSCULAR | Status: AC
  Filled 2022-10-03: qty 1

## 2022-10-03 MED ORDER — ACETAMINOPHEN 500 MG PO TABS
ORAL_TABLET | ORAL | Status: AC
  Filled 2022-10-03: qty 2

## 2022-10-03 MED ORDER — OXYCODONE HCL 5 MG PO TABS: 5.0000 mg | ORAL_TABLET | ORAL | 0 refills | Status: AC | PRN

## 2022-10-03 MED ORDER — HYDROCHLOROTHIAZIDE 25 MG PO TABS
ORAL_TABLET | ORAL | Status: AC
  Filled 2022-10-03: qty 1

## 2022-10-03 MED ORDER — DOCUSATE SODIUM 100 MG PO CAPS
ORAL_CAPSULE | ORAL | Status: AC
  Filled 2022-10-03: qty 1

## 2022-10-03 MED ORDER — SENNA 8.6 MG PO TABS
ORAL_TABLET | ORAL | Status: AC
  Filled 2022-10-03: qty 1

## 2022-10-03 MED ORDER — METHOCARBAMOL 500 MG PO TABS: 500.0000 mg | ORAL_TABLET | Freq: Four times a day (QID) | ORAL | 0 refills | Status: DC | PRN

## 2022-10-03 NOTE — Progress Notes (Signed)
   Neurosurgery Progress Note  History: Jean Khan is a 77 y.o s/p L3-S1 decompression.  POD1: NAEO. Pt reports some recurrent left leg pain with turning in bed last night but this has improved this morning  Physical Exam: Vitals:   10/03/22 0614 10/03/22 0743  BP: 137/75 (!) 147/69  Pulse: 77 70  Resp: 16 17  Temp: 98.7 F (37.1 C) 97.7 F (36.5 C)  SpO2: 96% 94%    AA Ox3 CNI  Strength:5/5 throughout BLE  HV output 60 since surgery   Data:  Other tests/results: none   Assessment/Plan:  Jean Khan is a 77 y,o presenting with lumbar stenosis and neurogenic claudication s/p lumbar decompression.   - mobilize - pain control - DVT prophylaxis - HV removed 7/11 - PTOT; plan to d/c home if cleared by therapy   Manning Charity PA-C Department of Neurosurgery

## 2022-10-03 NOTE — Evaluation (Signed)
Occupational Therapy Evaluation Patient Details Name: NYISHA CLIPPARD MRN: [id number] DOB: 1946/01/15 Today's Date: 10/03/2022   History of Present Illness Pt is a 77 y/o F admitted on 10/02/22 for scheduled L3-S1 decompression. PMH: arthritis, HTN   Clinical Impression   Upon entering the room, pt supine in bed with several family members present. Pt able to verbalized back precautions during session with min cuing for technique to maintain during self care needs. Pt performing supine >sit without physical assistance and performs UB dressing with set up A to obtain needed items. Pt able to utilize figure four position to don clothing onto R LE and attempts circle sitting with L LE but unable and needing min A to thread onto foot. Pt stands to pull clothing over B hips with min guard for balance. Pt ambulating to sit in recliner chair with RW and supervision. All education completed and no further need for skilled OT intervention at this time. OT to complete orders.      Recommendations for follow up therapy are one component of a multi-disciplinary discharge planning process, led by the attending physician.  Recommendations may be updated based on patient status, additional functional criteria and insurance authorization.   Assistance Recommended at Discharge Intermittent Supervision/Assistance  Patient can return home with the following A little help with walking and/or transfers;A little help with bathing/dressing/bathroom;Assistance with cooking/housework;Assist for transportation;Help with stairs or ramp for entrance    Functional Status Assessment  Patient has had a recent decline in their functional status and demonstrates the ability to make significant improvements in function in a reasonable and predictable amount of time.  Equipment Recommendations  None recommended by OT       Precautions / Restrictions Precautions Precautions: Back;Fall Restrictions Weight Bearing Restrictions:  No      Mobility Bed Mobility Overal bed mobility: Needs Assistance Bed Mobility: Supine to Sit   Sidelying to sit: Supervision            Transfers Overall transfer level: Needs assistance Equipment used: Rolling walker (2 wheels) Transfers: Sit to/from Stand Sit to Stand: Supervision                  Balance Overall balance assessment: Modified Independent Sitting-balance support: Feet supported Sitting balance-[name]: Normal     Standing balance support: Bilateral upper extremity supported, During functional activity, Reliant on assistive device for balance Standing balance-[name]: Fair                             ADL either performed or assessed with clinical judgement   ADL Overall ADL's : Needs assistance/impaired                 Upper Body Dressing : Set up;Sitting   Lower Body Dressing: Minimal assistance;Sit to/from stand Lower Body Dressing Details (indicate cue type and reason): min A to thread clothing onto L LE  but able to utilize figure four position for R LE Toilet Transfer: Supervision/safety;Rolling walker (2 wheels) Toilet Transfer Details (indicate cue type and reason): simulated                 Vision Patient Visual Report: No change from baseline              Pertinent Vitals/Pain Pain Assessment Pain Assessment: Faces Faces Pain Scale: Hurts a little bit Pain Location: back Pain Descriptors / Indicators: Discomfort Pain Intervention(s): Monitored during session, Repositioned  Hand Dominance Right   Extremity/Trunk Assessment Upper Extremity Assessment Upper Extremity Assessment: Overall WFL for tasks assessed           Communication Communication Communication: No difficulties   Cognition Arousal/Alertness: Awake/alert Behavior During Therapy: WFL for tasks assessed/performed Overall Cognitive Status: Within Functional Limits for tasks assessed                                                   Home Living Family/patient expects to be discharged to:: Private residence Living Arrangements: Spouse/significant other Available Help at Discharge: Family;Available 24 hours/day Type of Home: House Home Access: Stairs to enter [employer] of Steps: 3 Entrance Stairs-Rails: Left Home Layout: One level     Bathroom Shower/Tub: Chief Strategy Officer: Standard     Home Equipment: Agricultural consultant (2 wheels);Rollator (4 wheels);BSC/3in1          Prior Functioning/Environment Prior Level of Function : Independent/Modified Independent;Driving             Mobility Comments: Pt was ambulatory with PRN use of rollator 2/2 L knee pain/weakness. Pt still driving, denies falls, anticipating L TKA in the future. ADLs Comments: Mod I with self care needs                 OT Goals(Current goals can be found in the care plan section) Acute Rehab OT Goals Patient Stated Goal: to go home OT Goal Formulation: With patient/family Time For Goal Achievement: 10/03/22 Potential to Achieve Goals: Fair  OT Frequency:         AM-PAC OT "6 Clicks" Daily Activity     Outcome Measure Help from another person eating meals?: None Help from another person taking care of personal grooming?: None Help from another person toileting, which includes using toliet, bedpan, or urinal?: A Little Help from another person bathing (including washing, rinsing, drying)?: A Little Help from another person to put on and taking off regular upper body clothing?: None Help from another person to put on and taking off regular lower body clothing?: A Little 6 Click Score: 21   End of Session Equipment Utilized During Treatment: Rolling walker (2 wheels) Nurse Communication: Mobility status  Activity Tolerance: Patient tolerated treatment well Patient left: in chair;with call bell/phone within reach;with family/visitor present                    Time: 0925-0939 OT Time Calculation (min): 14 min Charges:  OT General Charges $OT Visit: 1 Visit OT Evaluation $OT Eval Low Complexity: 1 Low  Jackquline Denmark, MS, OTR/L , CBIS ascom [phone]  10/03/22, 11:32 AM

## 2022-10-03 NOTE — Progress Notes (Signed)
Pt refused Lovenox this morning, pt educated and verbalized understanding. Manning Charity at bedside, and notified. No orders received.

## 2022-10-03 NOTE — Progress Notes (Signed)
DISCHARGE NOTE:  Pt given discharge instructions and verbalized understanding. Pt wheeled to car by staff, husband providing transportation.  

## 2022-10-03 NOTE — Plan of Care (Signed)
  Problem: Activity: Goal: Ability to tolerate increased activity will improve Outcome: Progressing   Problem: Pain Management: Goal: Pain level will decrease Outcome: Progressing   Problem: Bladder/Genitourinary: Goal: Urinary functional status for postoperative course will improve Outcome: Progressing

## 2022-10-03 NOTE — Progress Notes (Signed)
Physical Therapy Treatment Patient Details Name: Jean Khan MRN: [id number] DOB: [birth year] Today's Date: 10/03/2022   History of Present Illness Pt is a 77 y/o F admitted on 10/02/22 for scheduled L3-S1 decompression. PMH: arthritis, HTN    PT Comments  Patient received sitting up in recliner, daughter and spouse at bedside. Patient is dressed and ready for PT. She is supervision for sit to stand and for ambulation 200 feet with RW. She ambulated up/down 4 steps with single left rail and supervision. Patient progressing well. Does not feel that she will need home health services and has all equipment needed at home. She is safe to discharge from mobility standpoint.      Assistance Recommended at Discharge Intermittent Supervision/Assistance  If plan is discharge home, recommend the following:  Can travel by private vehicle    A little help with walking and/or transfers;A little help with bathing/dressing/bathroom;Help with stairs or ramp for entrance;Assist for transportation;Assistance with cooking/housework      Equipment Recommendations  None recommended by PT    Recommendations for Other Services       Precautions / Restrictions Precautions Precautions: Back;Fall Restrictions Weight Bearing Restrictions: No     Mobility  Bed Mobility               General bed mobility comments: NT patient up in recliner upon arrival    Transfers Overall transfer level: Needs assistance Equipment used: Rolling walker (2 wheels) Transfers: Sit to/from Stand Sit to Stand: Supervision           General transfer comment: supervision for sit to stand from recliner    Ambulation/Gait Ambulation/Gait assistance: Supervision Gait Distance (Feet): 200 Feet Assistive device: Rolling walker (2 wheels) Gait Pattern/deviations: Step-through pattern, Decreased step length - right, Decreased step length - left, Decreased stride length, Decreased weight shift to left Gait  velocity: decreased     General Gait Details: Pt with decreased step length RLE 2/2 decreased weight shifting to LLE 2/2 chronic L knee pain. BLE step length does improve as gait progresses.   Stairs Stairs: Yes Stairs assistance: Supervision Stair Management: One rail Left, Sideways Number of Stairs: 4 General stair comments: supervision for steps   Wheelchair Mobility     Tilt Bed    [name] (Stroke Patients Only)       Balance Overall balance assessment: Modified Independent Sitting-balance support: Feet supported Sitting balance-[name]: Normal     Standing balance support: Bilateral upper extremity supported, During functional activity, Reliant on assistive device for balance Standing balance-[name]: Fair                              Cognition Arousal/Alertness: Awake/alert Behavior During Therapy: WFL for tasks assessed/performed Overall Cognitive Status: Within Functional Limits for tasks assessed                                          Exercises      General Comments        Pertinent Vitals/Pain Pain Assessment Pain Assessment: No/denies pain Pain Location: Reports knees hurt more than back Pain Descriptors / Indicators: Discomfort    Home Living                          Prior Function  PT Goals (current goals can now be found in the care plan section) Acute Rehab PT Goals Patient Stated Goal: get better, go home PT Goal Formulation: With patient Time For Goal Achievement: 10/16/22 Potential to Achieve Goals: Good Progress towards PT goals: Progressing toward goals    Frequency    Min 4X/week      PT Plan Discharge plan needs to be updated    Co-evaluation              AM-PAC PT "6 Clicks" Mobility   Outcome Measure  Help needed turning from your back to your side while in a flat bed without using bedrails?: None Help needed moving from lying on your  back to sitting on the side of a flat bed without using bedrails?: A Little Help needed moving to and from a bed to a chair (including a wheelchair)?: A Little Help needed standing up from a chair using your arms (e.g., wheelchair or bedside chair)?: None Help needed to walk in hospital room?: A Little Help needed climbing 3-5 steps with a railing? : A Little 6 Click Score: 20    End of Session Equipment Utilized During Treatment: Gait belt Activity Tolerance: Patient tolerated treatment well Patient left: in chair;with family/visitor present Nurse Communication: Mobility status PT Visit Diagnosis: Difficulty in walking, not elsewhere classified (R26.2);Pain Pain - Right/Left: Left Pain - part of body: Knee     Time: 3329-5188 PT Time Calculation (min) (ACUTE ONLY): 13 min  Charges:    $Gait Training: 8-22 mins PT General Charges $$ ACUTE PT VISIT: 1 Visit                     Lorien Shingler, PT, GCS 10/03/22,10:05 AM

## 2022-10-03 NOTE — Plan of Care (Signed)
  Problem: Activity: Goal: Ability to avoid complications of mobility impairment will improve Outcome: Progressing   Problem: Pain Management: Goal: Pain level will decrease Outcome: Progressing   Problem: Bladder/Genitourinary: Goal: Urinary functional status for postoperative course will improve Outcome: Progressing   

## 2022-10-03 NOTE — Discharge Summary (Signed)
Discharge Summary  Patient ID: Jean Khan MRN: [id number] DOB/AGE: Jan 26, 1946 77 y.o.  Admit date: 10/02/2022 Discharge date: 10/03/2022  Admission Diagnoses: lumbar stenosis causing neurogenic claudication (ICD10 M48.062).  Discharge Diagnoses:  Principal Problem:   S/P laminectomy Active Problems:   Neurogenic claudication due to lumbar spinal stenosis   Discharged Condition: good  Hospital Course:  Is a pleasant 77 year old presenting with lumbar stenosis and neurogenic claudication status post L3-S1 decompression.  Her intraoperative course was uncomplicated.  She was admitted overnight for pain control, drain output monitoring, and therapy evaluation.  Her drain output reduced to an acceptable level and was removed on the morning of postop day 1.  She was seen and evaluated by therapy and deemed appropriate for discharge home on postop day 1 .  She was given prescriptions for oxycodone and Robaxin but refused a prescription for senna.  Consults: None  Significant Diagnostic Studies: none  Treatments: therapies: PT and OT and surgery: as above. Please see separately dictated operative report for further details   Discharge Exam: Blood pressure (!) 147/69, pulse 70, temperature 97.7 F (36.5 C), resp. rate 17, height 5\' 5"  (1.651 m), weight 99.5 kg, SpO2 94%. AA Ox3 CNI   Strength:5/5 throughout BLE   Disposition: Discharge disposition: 01-Home or Self Care       Discharge Instructions     Diet - low sodium heart healthy   Complete by: As directed    Incentive spirometry RT   Complete by: As directed       Allergies as of 10/03/2022   No Known Allergies      Medication List     STOP taking these medications    HYDROcodone-acetaminophen 5-325 MG tablet Commonly known as: NORCO/VICODIN       TAKE these medications    acetaminophen 500 MG tablet Commonly known as: TYLENOL Take 500 mg by mouth every 6 (six) hours as needed.   amLODipine 5 MG  tablet Commonly known as: NORVASC Take 5 mg by mouth daily.   diphenhydrAMINE 25 mg capsule Commonly known as: BENADRYL Take 25 mg by mouth at bedtime as needed.   hydrochlorothiazide 12.5 MG capsule Commonly known as: MICROZIDE Take 12.5 mg by mouth daily.   lansoprazole 15 MG capsule Commonly known as: PREVACID Take 15 mg by mouth daily at 12 noon.   methocarbamol 500 MG tablet Commonly known as: ROBAXIN Take 1 tablet (500 mg total) by mouth every 6 (six) hours as needed for muscle spasms.   naproxen sodium 220 MG tablet Commonly known as: ALEVE Take 400 mg by mouth daily as needed.   oxyCODONE 5 MG immediate release tablet Commonly known as: Oxy IR/ROXICODONE Take 1 tablet (5 mg total) by mouth every 4 (four) hours as needed for up to 5 days for moderate pain or severe pain ((score 4 to 6)).         Signed: Susanne Borders 10/03/2022, 8:44 AM

## 2022-10-11 NOTE — Progress Notes (Signed)
   REFERRING PHYSICIAN:  Gracelyn Nurse, Md [address] [place],  [place] [address]  DOS: 10/02/22  L3-S1 decompression  HISTORY OF PRESENT ILLNESS: Jean Khan is approximately 2 weeks status post L3-S1 decompression. Was given oxycodone and robaxin on discharge from the hospital.   She has some mild LBP. Her preop leg pain is gone! She is not taking the oxycodone or robaxin. She has some hydrocodone at home and is not taking this either.    PHYSICAL EXAMINATION:  General: Patient is well developed, well nourished, calm, collected, and in no apparent distress.   NEUROLOGICAL:  General: In no acute distress.   Awake, alert, oriented to person, place, and time.  Pupils equal round and reactive to light.  Facial tone is symmetric.     Strength:            Side Iliopsoas Quads Hamstring PF DF EHL  R 5 5 5 5 5 5   L 5 5 5 5 5 5    Incision c/d/I, mild bruising lower back.     ROS (Neurologic):  Negative except as noted above  IMAGING: Nothing new to review.   ASSESSMENT/PLAN:  CHELSAY SALCIDO is doing well s/p above surgery. Treatment options reviewed with patient and following plan made:   - I have advised the patient to lift up to 10 pounds until 6 weeks after surgery (follow up with Dr. Myer Haff).  - Reviewed wound care.  - No bending, twisting, or lifting.  - Follow up as scheduled in 4 weeks and prn.   She will wait to see Dr. Myer Haff at her next visit to discuss when she can schedule her TKA.   Advised to contact the office if any questions or concerns arise.  Drake Leach PA-C Department of neurosurgery

## 2022-10-15 NOTE — Anesthesia Postprocedure Evaluation (Signed)
Anesthesia Post Note  Patient: KATIANA RULAND  Procedure(s) Performed: L3-S1 DECOMPRESSION (Spine Lumbar)  Patient location during evaluation: PACU Anesthesia Type: General Level of consciousness: awake and alert Pain management: pain level controlled Vital Signs Assessment: post-procedure vital signs reviewed and stable Respiratory status: spontaneous breathing, nonlabored ventilation, respiratory function stable and patient connected to nasal cannula oxygen Cardiovascular status: blood pressure returned to baseline and stable Postop Assessment: no apparent nausea or vomiting Anesthetic complications: no   No notable events documented.   Last Vitals:  Vitals:   10/03/22 0614 10/03/22 0743  BP: 137/75 (!) 147/69  Pulse: 77 70  Resp: 16 17  Temp: 37.1 C 36.5 C  SpO2: 96% 94%    Last Pain:  Vitals:   10/03/22 1138  TempSrc:   PainSc: 0-No pain                 Yevette Edwards

## 2022-10-16 ENCOUNTER — Ambulatory Visit (INDEPENDENT_AMBULATORY_CARE_PROVIDER_SITE_OTHER): Payer: PPO | Admitting: Orthopedic Surgery

## 2022-10-16 ENCOUNTER — Encounter: Payer: Self-pay | Admitting: Orthopedic Surgery

## 2022-10-16 VITALS — BP 128/84 | Temp 97.7°F | Wt 215.0 lb

## 2022-10-16 DIAGNOSIS — M48062 Spinal stenosis, lumbar region with neurogenic claudication: Secondary | ICD-10-CM

## 2022-10-16 DIAGNOSIS — I10 Essential (primary) hypertension: Secondary | ICD-10-CM | POA: Diagnosis not present

## 2022-10-16 DIAGNOSIS — Z9889 Other specified postprocedural states: Secondary | ICD-10-CM

## 2022-10-16 DIAGNOSIS — Z09 Encounter for follow-up examination after completed treatment for conditions other than malignant neoplasm: Secondary | ICD-10-CM

## 2022-10-23 DIAGNOSIS — F339 Major depressive disorder, recurrent, unspecified: Secondary | ICD-10-CM | POA: Diagnosis not present

## 2022-10-23 DIAGNOSIS — K760 Fatty (change of) liver, not elsewhere classified: Secondary | ICD-10-CM | POA: Diagnosis not present

## 2022-10-23 DIAGNOSIS — G500 Trigeminal neuralgia: Secondary | ICD-10-CM | POA: Diagnosis not present

## 2022-10-23 DIAGNOSIS — D509 Iron deficiency anemia, unspecified: Secondary | ICD-10-CM | POA: Diagnosis not present

## 2022-10-23 DIAGNOSIS — I10 Essential (primary) hypertension: Secondary | ICD-10-CM | POA: Diagnosis not present

## 2022-11-14 ENCOUNTER — Ambulatory Visit (INDEPENDENT_AMBULATORY_CARE_PROVIDER_SITE_OTHER): Payer: PPO | Admitting: Neurosurgery

## 2022-11-14 ENCOUNTER — Encounter: Payer: Self-pay | Admitting: Neurosurgery

## 2022-11-14 VITALS — BP 126/74 | Ht 65.0 in | Wt 215.0 lb

## 2022-11-14 DIAGNOSIS — Z09 Encounter for follow-up examination after completed treatment for conditions other than malignant neoplasm: Secondary | ICD-10-CM

## 2022-11-14 DIAGNOSIS — M48062 Spinal stenosis, lumbar region with neurogenic claudication: Secondary | ICD-10-CM

## 2022-11-14 NOTE — Progress Notes (Signed)
   REFERRING PHYSICIAN:  Gracelyn Nurse, Md [address] [place],  [place] [address]  DOS: 10/02/22  L3-S1 decompression  HISTORY OF PRESENT ILLNESS: Jean Khan is status post L3-S1 decompression.    She is doing very well.  She has minimal pain.  She continues to have left knee pain.   PHYSICAL EXAMINATION:  General: Patient is well developed, well nourished, calm, collected, and in no apparent distress.   NEUROLOGICAL:  General: In no acute distress.   Awake, alert, oriented to person, place, and time.  Pupils equal round and reactive to light.  Facial tone is symmetric.     Strength:            Side Iliopsoas Quads Hamstring PF DF EHL  R 5 5 5 5 5 5   L 5 5 5 5 5 5    Incision c/d/I.   ROS (Neurologic):  Negative except as noted above  IMAGING: Nothing new to review.   ASSESSMENT/PLAN:  Jean Khan is doing well s/p above surgery.   We reviewed her activity limitations.  She is currently severely limited due to knee pain.  It is time for her to consider knee replacement.  I encouraged her to contact Dr. Ernest Pine to schedule surgical intervention.  She is cleared from my standpoint to pursue knee replacement at any point.  Will see her back in clinic in 6 weeks.    Venetia Night MD Department of neurosurgery

## 2022-12-22 NOTE — Progress Notes (Deleted)
   REFERRING PHYSICIAN:  Gracelyn Nurse, Md [address] [place],  [place] [address]  DOS: 10/02/22  L3-S1 decompression  HISTORY OF PRESENT ILLNESS: Jean Khan was doing well at her last visit. Primary complaint was knee pain- she was cleared to go forward with TKA.       PHYSICAL EXAMINATION:  General: Patient is well developed, well nourished, calm, collected, and in no apparent distress.   NEUROLOGICAL:  General: In no acute distress.   Awake, alert, oriented to person, place, and time.  Pupils equal round and reactive to light.  Facial tone is symmetric.     Strength:            Side Iliopsoas Quads Hamstring PF DF EHL  R 5 5 5 5 5 5   L 5 5 5 5 5 5    Incision well healed.    ROS (Neurologic):  Negative except as noted above  IMAGING: Nothing new to review.   ASSESSMENT/PLAN:  Jean Khan is doing well s/p above surgery. Treatment options reviewed with patient and following plan made:   - I have advised the patient to lift up to 10 pounds until 6 weeks after surgery (follow up with Dr. Myer Haff).  - Reviewed wound care.  - No bending, twisting, or lifting.  - Follow up as scheduled in 4 weeks and prn.   She will wait to see Dr. Myer Haff at her next visit to discuss when she can schedule her TKA.   Advised to contact the office if any questions or concerns arise.  Drake Leach PA-C Department of neurosurgery

## 2022-12-24 ENCOUNTER — Encounter: Payer: PPO | Admitting: Orthopedic Surgery

## 2022-12-24 DIAGNOSIS — M48062 Spinal stenosis, lumbar region with neurogenic claudication: Secondary | ICD-10-CM

## 2022-12-24 DIAGNOSIS — Z9889 Other specified postprocedural states: Secondary | ICD-10-CM

## 2023-01-09 DIAGNOSIS — M1712 Unilateral primary osteoarthritis, left knee: Secondary | ICD-10-CM | POA: Diagnosis not present

## 2023-02-06 DIAGNOSIS — H353131 Nonexudative age-related macular degeneration, bilateral, early dry stage: Secondary | ICD-10-CM | POA: Diagnosis not present

## 2023-02-06 DIAGNOSIS — H43813 Vitreous degeneration, bilateral: Secondary | ICD-10-CM | POA: Diagnosis not present

## 2023-02-06 DIAGNOSIS — H0259 Other disorders affecting eyelid function: Secondary | ICD-10-CM | POA: Diagnosis not present

## 2023-02-06 DIAGNOSIS — Z961 Presence of intraocular lens: Secondary | ICD-10-CM | POA: Diagnosis not present

## 2023-03-13 DIAGNOSIS — M1712 Unilateral primary osteoarthritis, left knee: Secondary | ICD-10-CM | POA: Diagnosis not present

## 2023-03-13 DIAGNOSIS — M25562 Pain in left knee: Secondary | ICD-10-CM | POA: Diagnosis not present

## 2023-03-23 NOTE — Discharge Instructions (Signed)

## 2023-03-23 NOTE — H&P (Signed)
ORTHOPAEDIC HISTORY & PHYSICAL Raenette Rover, [place] - 03/13/2023 1:15 PM EST Formatting of this note is different from the original. NAME: Jean Khan H&P Date: 03/13/2023 Procedure Date: 04/02/2023  Chief Complaint: left knee pain, swelling, and locking  HPI Jean Khan is a 77 y.o. female who has severe knee pain, intermittent swelling and locking of the knee. Patient states that it does sometimes feel like it is going to give out. She has had a long history of left knee pain that has been persistent for the last many years. She localizes most of the pain along the medial aspect of her knee. She does report having some intermittent swelling, locking and giving way. She states that the pain is aggravated with any weightbearing or prolonged standing activity. It has begun to limit her ability to walk long distances and perform ADLs. She has failed conservative treatment including Tylenol, NSAIDs, intra-articular corticosteroid injections, and activity modification. She currently utilizes a cane or walker for ambulation assistance, although presents today in a wheelchair to clinic. She has requested operative intervention for relief of her DJD symptoms. Regarding the patient's past medical history, she denies having any known cardiac or pulmonary issues. She denies any history of clots or DVTs. No history of diabetes. There is no previous surgeries on this left knee. 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 She does report having intermittent constipation due to this issue.  Social Hx: Patient lives at home with her husband, and states that she has 3 daughters that live around the area. She denies any alcohol, tobacco/nicotine, or illicit drug use.  Medications & Allergies Allergies: No Known Allergies  Home  Medicines: Current Outpatient Medications on File Prior to Visit Medication Sig Dispense Refill acetaminophen (TYLENOL) 325 MG tablet Take 650 mg by mouth every 6 (six) hours as needed for Pain. amLODIPine (NORVASC) 5 MG tablet take 1 tablet by mouth every day 90 tablet 1 chlorhexidine (PERIDEX) 0.12 % solution Swish and spit 15 mLs 2 (two) times daily 12 hydroCHLOROthiazide (HYDRODIURIL) 12.5 MG tablet take 1 tablet by mouth every day 90 tablet 1 lansoprazole (PREVACID) 30 MG DR capsule Take 1 capsule (30 mg total) by mouth once daily as needed loratadine (CLARITIN) 10 mg tablet Take 10 mg by mouth once daily as needed  No current facility-administered medications on file prior to visit.  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 Elenora Fender SECTION   Physical Exam  Ht:165.1 cm (5\' 5" ) Wt:96 kg (211 lb 9.6 oz) BMI: Body mass index is 35.21 kg/m.  General/Constitutional: No apparent distress: well-nourished and well developed. Eyes: Pupils equal, round with synchronous movement. Lymphatic: No palpable adenopathy. Respiratory: Patient has good chest rise and fall with inspiration and expiration. All lung fields are clear to auscultation bilaterally. There is no Rales, rhonchi or wheezes appreciated. Cardiovascular: Upon auscultation there is a regular rate and rhythm without any murmurs, rubs, gallops or heaves appreciated. There does not appear to be any swelling down the lower extremities. Posterior tibial pulses appreciated bilaterally, 2+. Integumentary: No impressive  skin lesions present,  except as noted in detailed exam. Neuro/Psych: Normal mood and affect, oriented to person, place and time. Musculoskeletal: see exam below  Left knee exam Upon inspection of the patient's left knee there does not appear to be any skin changes, open abrasions, swelling or redness. There is a relative varus alignment. Upon palpation, the patient reports having pain along the medial and lateral aspect of their knee. Patient has 3 degrees off of full extension actively with ROM, and able to flex back to 118 degrees with mild pain. Varus and valgus stress testing shows positive pseudolaxity to valgus stressing. Noted to have a Baker's cyst present on the back aspect of the knee. The patella tracks well within the femoral groove from flexion into extension with some underlying crepitus appreciated. Anterior and posterior drawer testing negative. Patient is neurovascularly intact down their lower extremity to all dermatomes. Posterior tibial pulses appreciated 2+.  Imaging left Knee Imaging: None ordered today. Her films from 01/09/2023 were reviewed. These images show significant narrowing of the medial cartilage space with 100% joint space narrowing with bone-on-bone articulation. There is noted erosions and noted deformity of the medial femoral condyle. There is noticeable subchondral changes as well as osteophyte formation present. No fractures, lytic lesions or gross deformities appreciated on films.  Assesment and Plan Knee DJD  I have recommended that Jean Khan undergo left total knee replacement. Consents has been signed. The risks, benefits, prognosis and alternatives including but not limited to DVT, PE, infection, neurovascular injury, failure of the procedure and death were explained to the patient and she is willing to proceed with surgery as described to her by myself. Plan will be for post operative admission of at least 1 midnight for pain control and PT. She will be managed with DVT  prophylaxis, antibiotics preoperatively for 24 hours and aggressive in patient rehab.  Pre, intra and post op interventions were discussed. Patient has good understanding  Medication Reconciliation was performed. Discussed cessation of vitamins and supplements.  A total of 50 minutes was spent reviewing patient's charts, medical reconciliation, discussing/educating the patient about surgical interventions, and answering any questions provided by the patient.  JOSHUA Kendrick Fries, PA [facility] 03/13/2023  Electronically signed by Raenette Rover, PA at 03/13/2023 3:52 PM EST

## 2023-03-30 ENCOUNTER — Encounter: Payer: Self-pay | Admitting: Orthopedic Surgery

## 2023-03-31 ENCOUNTER — Encounter
Admission: RE | Admit: 2023-03-31 | Discharge: 2023-03-31 | Disposition: A | Discharge status: Home / Self Care | Payer: PPO | Source: Ambulatory Visit | Attending: Orthopedic Surgery | Admitting: Orthopedic Surgery

## 2023-03-31 ENCOUNTER — Other Ambulatory Visit: Payer: Self-pay

## 2023-03-31 ENCOUNTER — Inpatient Hospital Stay: Admission: RE | Admit: 2023-03-31 | Payer: PPO | Source: Ambulatory Visit

## 2023-03-31 DIAGNOSIS — Z01818 Encounter for other preprocedural examination: Secondary | ICD-10-CM | POA: Diagnosis not present

## 2023-03-31 DIAGNOSIS — M1712 Unilateral primary osteoarthritis, left knee: Secondary | ICD-10-CM | POA: Diagnosis not present

## 2023-03-31 DIAGNOSIS — D649 Anemia, unspecified: Secondary | ICD-10-CM | POA: Insufficient documentation

## 2023-03-31 DIAGNOSIS — Z01810 Encounter for preprocedural cardiovascular examination: Secondary | ICD-10-CM | POA: Diagnosis not present

## 2023-03-31 HISTORY — DX: Bacteremia: R78.81

## 2023-03-31 HISTORY — DX: Sciatica, unspecified side: M54.30

## 2023-03-31 HISTORY — DX: Other bacterial infections of unspecified site: A49.8

## 2023-03-31 HISTORY — DX: Dorsalgia, unspecified: M54.9

## 2023-03-31 LAB — CBC
HCT: 43.9 % (ref 36.0–46.0)
Hemoglobin: 14.4 g/dL (ref 12.0–15.0)
MCH: 29.4 pg (ref 26.0–34.0)
MCHC: 32.8 g/dL (ref 30.0–36.0)
MCV: 89.6 fL (ref 80.0–100.0)
Platelets: 382 10*3/uL (ref 150–400)
RBC: 4.9 MIL/uL (ref 3.87–5.11)
RDW: 16.3 % — ABNORMAL HIGH (ref 11.5–15.5)
WBC: 7.9 10*3/uL (ref 4.0–10.5)
nRBC: 0 % (ref 0.0–0.2)

## 2023-03-31 LAB — URINALYSIS, ROUTINE W REFLEX MICROSCOPIC
Bilirubin Urine: NEGATIVE
Glucose, UA: NEGATIVE mg/dL
Hgb urine dipstick: NEGATIVE
Ketones, ur: NEGATIVE mg/dL
Nitrite: NEGATIVE
Protein, ur: NEGATIVE mg/dL
Specific Gravity, Urine: 1.009 (ref 1.005–1.030)
pH: 5 (ref 5.0–8.0)

## 2023-03-31 LAB — C-REACTIVE PROTEIN: CRP: 1.2 mg/dL — ABNORMAL HIGH (ref <1.0)

## 2023-03-31 LAB — COMPREHENSIVE METABOLIC PANEL
ALT: 20 U/L (ref 0–44)
AST: 29 U/L (ref 15–41)
Albumin: 4.1 g/dL (ref 3.5–5.0)
Alkaline Phosphatase: 105 U/L (ref 38–126)
Anion gap: 13 (ref 5–15)
BUN: 18 mg/dL (ref 8–23)
CO2: 26 mmol/L (ref 22–32)
Calcium: 9.6 mg/dL (ref 8.9–10.3)
Chloride: 97 mmol/L — ABNORMAL LOW (ref 98–111)
Creatinine, Ser: 1.04 mg/dL — ABNORMAL HIGH (ref 0.44–1.00)
GFR, Estimated: 55 mL/min — ABNORMAL LOW (ref >60)
Glucose, Bld: 100 mg/dL — ABNORMAL HIGH (ref 70–99)
Potassium: 3.5 mmol/L (ref 3.5–5.1)
Sodium: 136 mmol/L (ref 135–145)
Total Bilirubin: 0.7 mg/dL (ref 0.0–1.2)
Total Protein: 8 g/dL (ref 6.5–8.1)

## 2023-03-31 LAB — SEDIMENTATION RATE: Sed Rate: 21 mm/h (ref 0–30)

## 2023-03-31 NOTE — Patient Instructions (Addendum)
 Your procedure is scheduled on:  Wednesday January 8  Report to the Registration Desk on the 1st floor of the [facility] To find out your arrival time, please call [phone] between 1PM - 3PM on: Tuesday January 7  If your arrival time is 6:00 am, do not arrive before that time as the [facility] entrance doors do not open until 6:00 am.  REMEMBER: Instructions that are not followed completely may result in serious medical risk, up to and including death; or upon the discretion of your surgeon and anesthesiologist your surgery may need to be rescheduled.  Do not eat food after midnight the night before surgery.  No gum chewing or hard candies.  You may however, drink CLEAR liquids up to 2 hours before you are scheduled to arrive for your surgery. Do not drink anything within 2 hours of your scheduled arrival time.  Clear liquids include: - water  - apple juice without pulp - gatorade (not RED colors) - black coffee or tea (Do NOT add milk or creamers to the coffee or tea) Do NOT drink anything that is not on this list.  In addition, your doctor has ordered for you to drink the provided:  Ensure Pre-Surgery Clear Carbohydrate Drink  Drinking this carbohydrate drink up to two hours before surgery helps to reduce insulin resistance and improve patient outcomes. Please complete drinking 2 hours before scheduled arrival time.  One week prior to surgery: Wednesday January 1  Stop Anti-inflammatories (NSAIDS) such as Advil, Aleve, Ibuprofen, Motrin, Naproxen, Naprosyn and Aspirin  based products such as Excedrin, Goody's Powder, BC Powder. Stop ANY OVER THE COUNTER supplements until after surgery.  You may however, continue to take Tylenol  if needed for pain up until the day of surgery.  Continue taking all of your other prescription medications up until the day of surgery.  ON THE DAY OF SURGERY ONLY TAKE THESE MEDICATIONS WITH SIPS OF WATER:  amLODipine    No Alcohol  for 24  hours before or after surgery.  No Smoking including e-cigarettes for 24 hours before surgery.  No chewable tobacco products for at least 6 hours before surgery.  No nicotine patches on the day of surgery.  Do not use any recreational drugs for at least a week (preferably 2 weeks) before your surgery.  Please be advised that the combination of cocaine and anesthesia may have negative outcomes, up to and including death. If you test positive for cocaine, your surgery will be cancelled.  On the morning of surgery brush your teeth with toothpaste and water, you may rinse your mouth with mouthwash if you wish. Do not swallow any toothpaste or mouthwash.  Use CHG Soap as directed on instruction sheet.  Do not wear jewelry, make-up, hairpins, clips or nail polish.  For welded (permanent) jewelry: bracelets, anklets, waist bands, etc.  Please have this removed prior to surgery.  If it is not removed, there is a chance that hospital personnel will need to cut it off on the day of surgery.  Do not wear lotions, powders, or perfumes.   Do not shave body hair from the neck down 48 hours before surgery.  Contact lenses, hearing aids and dentures may not be worn into surgery.  Do not bring valuables to the hospital. [facility] is not responsible for any missing/lost belongings or valuables.   Notify your doctor if there is any change in your medical condition (cold, fever, infection).  Wear comfortable clothing (specific to your surgery type) to the  hospital.  After surgery, you can help prevent lung complications by doing breathing exercises.  Take deep breaths and cough every 1-2 hours. Your doctor may order a device called an Incentive Spirometer to help you take deep breaths.  If you are being admitted to the hospital overnight, leave your suitcase in the car. After surgery it may be brought to your room.  In case of increased patient census, it may be necessary for you, the patient, to  continue your postoperative care in the Same Day Surgery department.  If you are being discharged the day of surgery, you will not be allowed to drive home. You will need a responsible individual to drive you home and stay with you for 24 hours after surgery.   If you are taking public transportation, you will need to have a responsible individual with you.  Please call the Pre-admissions Testing Dept. at [phone] if you have any questions about these instructions.  Surgery Visitation Policy:  Patients having surgery or a procedure may have two visitors.  Children under the age of 76 must have an adult with them who is not the patient.  Inpatient Visitation:    Visiting hours are 7 a.m. to 8 p.m. Up to four visitors are allowed at one time in a patient room. The visitors may rotate out with other people during the day.  One visitor age 17 or older may stay with the patient overnight and must be in the room by 8 p.m.            Pre-operative 5 CHG Bath Instructions   You can play a key role in reducing the risk of infection after surgery. Your skin needs to be as free of germs as possible. You can reduce the number of germs on your skin by washing with CHG (chlorhexidine  gluconate) soap before surgery. CHG is an antiseptic soap that kills germs and continues to kill germs even after washing.   DO NOT use if you have an allergy to chlorhexidine /CHG or antibacterial soaps. If your skin becomes reddened or irritated, stop using the CHG and notify one of our RNs at [phone].   Please shower with the CHG soap starting 4 days before surgery using the following schedule:     Please keep in mind the following:  DO NOT shave, including legs and underarms, starting the day of your first shower.   You may shave your face at any point before/day of surgery.  Place clean sheets on your bed the day you start using CHG soap. Use a clean washcloth (not used since being washed)  for each shower. DO NOT sleep with pets once you start using the CHG.   CHG Shower Instructions:  If you choose to wash your hair and private area, wash first with your normal shampoo/soap.  After you use shampoo/soap, rinse your hair and body thoroughly to remove shampoo/soap residue.  Turn the water OFF and apply about 3 tablespoons (45 ml) of CHG soap to a CLEAN washcloth.  Apply CHG soap ONLY FROM YOUR NECK DOWN TO YOUR TOES (washing for 3-5 minutes)  DO NOT use CHG soap on face, private areas, open wounds, or sores.  Pay special attention to the area where your surgery is being performed.  If you are having back surgery, having someone wash your back for you may be helpful. Wait 2 minutes after CHG soap is applied, then you may rinse off the CHG soap.  Pat dry with a clean  towel  Put on clean clothes/pajamas   If you choose to wear lotion, please use ONLY the CHG-compatible lotions on the back of this paper.     Additional instructions for the day of surgery: DO NOT APPLY any lotions, deodorants, cologne, or perfumes.   Put on clean/comfortable clothes.  Brush your teeth.  Ask your nurse before applying any prescription medications to the skin.      CHG Compatible Lotions   Aveeno Moisturizing lotion  Cetaphil Moisturizing Cream  Cetaphil Moisturizing Lotion  Clairol Herbal Essence Moisturizing Lotion, Dry Skin  Clairol Herbal Essence Moisturizing Lotion, Extra Dry Skin  Clairol Herbal Essence Moisturizing Lotion, Normal Skin  Curel Age Defying Therapeutic Moisturizing Lotion with Alpha Hydroxy  Curel Extreme Care Body Lotion  Curel Soothing Hands Moisturizing Hand Lotion  Curel Therapeutic Moisturizing Cream, Fragrance-Free  Curel Therapeutic Moisturizing Lotion, Fragrance-Free  Curel Therapeutic Moisturizing Lotion, Original Formula  Eucerin Daily Replenishing Lotion  Eucerin Dry Skin Therapy Plus Alpha Hydroxy Crme  Eucerin Dry Skin Therapy Plus Alpha Hydroxy  Lotion  Eucerin Original Crme  Eucerin Original Lotion  Eucerin Plus Crme Eucerin Plus Lotion  Eucerin TriLipid Replenishing Lotion  Keri Anti-Bacterial Hand Lotion  Keri Deep Conditioning Original Lotion Dry Skin Formula Softly Scented  Keri Deep Conditioning Original Lotion, Fragrance Free Sensitive Skin Formula  Keri Lotion Fast Absorbing Fragrance Free Sensitive Skin Formula  Keri Lotion Fast Absorbing Softly Scented Dry Skin Formula  Keri Original Lotion  Keri Skin Renewal Lotion Keri Silky Smooth Lotion  Keri Silky Smooth Sensitive Skin Lotion  Nivea Body Creamy Conditioning Oil  Nivea Body Extra Enriched Lotion  Nivea Body Original Lotion  Nivea Body Sheer Moisturizing Lotion Nivea Crme  Nivea Skin Firming Lotion  NutraDerm 30 Skin Lotion  NutraDerm Skin Lotion  NutraDerm Therapeutic Skin Cream  NutraDerm Therapeutic Skin Lotion  ProShield Protective Hand Cream  Provon moisturizing lotion                          Preoperative Educational Videos for Total Hip, Knee and Shoulder Replacements  To better prepare for surgery, please view our videos that explain the physical activity and discharge planning required to have the best surgical recovery at [facility].   [email or web address]  Questions? Call [phone] or email [email or web address]           How to Use an Incentive Spirometer  An incentive spirometer is a tool that measures how well you are filling your lungs with each breath. Learning to take long, deep breaths using this tool can help you keep your lungs clear and active. This may help to reverse or lessen your chance of developing breathing (pulmonary) problems, especially infection. You may be asked to use a spirometer: After a surgery. If you have a lung problem or a history of smoking. After a long period of time when you have been  unable to move or be active. If the spirometer includes an indicator to show the highest number that you have reached, your health care provider or respiratory therapist will help you set a goal. Keep a log of your progress as told by your health care provider. What are the risks? Breathing too quickly may cause dizziness or cause you to pass out. Take your time so you do not get dizzy or light-headed. If you are in pain, you may need to take pain medicine before doing incentive spirometry. It is harder to take a deep breath  if you are having pain. How to use your incentive spirometer  Sit up on the edge of your bed or on a chair. Hold the incentive spirometer so that it is in an upright position. Before you use the spirometer, breathe out normally. Place the mouthpiece in your mouth. Make sure your lips are closed tightly around it. Breathe in slowly and as deeply as you can through your mouth, causing the piston or the ball to rise toward the top of the chamber. Hold your breath for 3-5 seconds, or for as long as possible. If the spirometer includes a coach indicator, use this to guide you in breathing. Slow down your breathing if the indicator goes above the marked areas. Remove the mouthpiece from your mouth and breathe out normally. The piston or ball will return to the bottom of the chamber. Rest for a few seconds, then repeat the steps 10 or more times. Take your time and take a few normal breaths between deep breaths so that you do not get dizzy or light-headed. Do this every 1-2 hours when you are awake. If the spirometer includes a goal marker to show the highest number you have reached (best effort), use this as a goal to work toward during each repetition. After each set of 10 deep breaths, cough a few times. This will help to make sure that your lungs are clear. If you have an incision on your chest or abdomen from surgery, place a pillow or a rolled-up towel firmly against the  incision when you cough. This can help to reduce pain while taking deep breaths and coughing. General tips When you are able to get out of bed: Walk around often. Continue to take deep breaths and cough in order to clear your lungs. Keep using the incentive spirometer until your health care provider says it is okay to stop using it. If you have been in the hospital, you may be told to keep using the spirometer at home. Contact a health care provider if: You are having difficulty using the spirometer. You have trouble using the spirometer as often as instructed. Your pain medicine is not giving enough relief for you to use the spirometer as told. You have a fever. Get help right away if: You develop shortness of breath. You develop a cough with bloody mucus from the lungs. You have fluid or blood coming from an incision site after you cough. Summary An incentive spirometer is a tool that can help you learn to take long, deep breaths to keep your lungs clear and active. You may be asked to use a spirometer after a surgery, if you have a lung problem or a history of smoking, or if you have been inactive for a long period of time. Use your incentive spirometer as instructed every 1-2 hours while you are awake. If you have an incision on your chest or abdomen, place a pillow or a rolled-up towel firmly against your incision when you cough. This will help to reduce pain. Get help right away if you have shortness of breath, you cough up bloody mucus, or blood comes from your incision when you cough. This information is not intended to replace advice given to you by your health care provider. Make sure you discuss any questions you have with your health care provider. Document Revised: 05/31/2019 Document Reviewed: 05/31/2019 Elsevier Patient Education  2023 [employer].

## 2023-03-31 NOTE — Progress Notes (Signed)
 Spoke with nurse who did pts in office anesthesia interview this morning. Nurse denies that pt was having any cardiac symptoms so per anesthesia ok to proceed with surgery

## 2023-04-01 DIAGNOSIS — I10 Essential (primary) hypertension: Secondary | ICD-10-CM | POA: Diagnosis not present

## 2023-04-01 DIAGNOSIS — M1712 Unilateral primary osteoarthritis, left knee: Secondary | ICD-10-CM | POA: Diagnosis not present

## 2023-04-01 LAB — SURGICAL PCR SCREEN
MRSA, PCR: NEGATIVE
Staphylococcus aureus: POSITIVE — AB

## 2023-04-02 ENCOUNTER — Observation Stay: Payer: PPO

## 2023-04-02 ENCOUNTER — Encounter
Admission: RE | Disposition: A | Discharge status: Home / Self Care | Payer: Self-pay | Source: Home / Self Care | Attending: Orthopedic Surgery

## 2023-04-02 ENCOUNTER — Other Ambulatory Visit: Payer: Self-pay

## 2023-04-02 ENCOUNTER — Encounter: Payer: Self-pay | Admitting: Orthopedic Surgery

## 2023-04-02 ENCOUNTER — Ambulatory Visit: Payer: PPO | Admitting: Certified Registered"

## 2023-04-02 ENCOUNTER — Observation Stay
Admission: RE | Admit: 2023-04-02 | Discharge: 2023-04-04 | Disposition: A | Discharge status: Home / Self Care | Payer: PPO | Attending: Orthopedic Surgery | Admitting: Orthopedic Surgery

## 2023-04-02 ENCOUNTER — Ambulatory Visit: Payer: PPO | Admitting: Urgent Care

## 2023-04-02 DIAGNOSIS — M1712 Unilateral primary osteoarthritis, left knee: Secondary | ICD-10-CM | POA: Diagnosis not present

## 2023-04-02 DIAGNOSIS — Z471 Aftercare following joint replacement surgery: Secondary | ICD-10-CM | POA: Diagnosis not present

## 2023-04-02 DIAGNOSIS — Z96652 Presence of left artificial knee joint: Secondary | ICD-10-CM | POA: Diagnosis not present

## 2023-04-02 DIAGNOSIS — M25462 Effusion, left knee: Secondary | ICD-10-CM | POA: Diagnosis not present

## 2023-04-02 DIAGNOSIS — D649 Anemia, unspecified: Secondary | ICD-10-CM

## 2023-04-02 DIAGNOSIS — Z01812 Encounter for preprocedural laboratory examination: Secondary | ICD-10-CM

## 2023-04-02 DIAGNOSIS — I10 Essential (primary) hypertension: Secondary | ICD-10-CM | POA: Insufficient documentation

## 2023-04-02 DIAGNOSIS — M85862 Other specified disorders of bone density and structure, left lower leg: Secondary | ICD-10-CM | POA: Diagnosis not present

## 2023-04-02 HISTORY — PX: KNEE ARTHROPLASTY: SHX992

## 2023-04-02 SURGERY — ARTHROPLASTY, KNEE, TOTAL, USING IMAGELESS COMPUTER-ASSISTED NAVIGATION
Anesthesia: Spinal | Site: Knee | Laterality: Left

## 2023-04-02 MED ORDER — TRANEXAMIC ACID-NACL 1000-0.7 MG/100ML-% IV SOLN
INTRAVENOUS | Status: AC
  Filled 2023-04-02: qty 100

## 2023-04-02 MED ORDER — FENTANYL CITRATE (PF) 100 MCG/2ML IJ SOLN: 25.0000 ug | INTRAMUSCULAR | Status: DC | PRN

## 2023-04-02 MED ORDER — ORAL CARE MOUTH RINSE: 15.0000 mL | Freq: Once | OROMUCOSAL | Status: AC

## 2023-04-02 MED ORDER — CHLORHEXIDINE GLUCONATE 0.12 % MT SOLN
15.0000 mL | Freq: Once | OROMUCOSAL | Status: AC
Start: 2023-04-02 — End: 2023-04-02
  Administered 2023-04-02: 15 mL via OROMUCOSAL

## 2023-04-02 MED ORDER — PROPOFOL 500 MG/50ML IV EMUL
INTRAVENOUS | Status: DC | PRN
  Administered 2023-04-02: 100 ug/kg/min via INTRAVENOUS

## 2023-04-02 MED ORDER — OXYCODONE HCL 5 MG PO TABS
5.0000 mg | ORAL_TABLET | ORAL | Status: DC | PRN
  Administered 2023-04-02 – 2023-04-04 (×8): 5 mg via ORAL

## 2023-04-02 MED ORDER — METOCLOPRAMIDE HCL 10 MG PO TABS
ORAL_TABLET | ORAL | Status: AC
  Filled 2023-04-02: qty 1

## 2023-04-02 MED ORDER — CELECOXIB 200 MG PO CAPS
ORAL_CAPSULE | ORAL | Status: AC
  Filled 2023-04-02: qty 1

## 2023-04-02 MED ORDER — GABAPENTIN 300 MG PO CAPS
ORAL_CAPSULE | ORAL | Status: AC
  Filled 2023-04-02: qty 1

## 2023-04-02 MED ORDER — ACETAMINOPHEN 10 MG/ML IV SOLN
INTRAVENOUS | Status: AC
  Filled 2023-04-02: qty 100

## 2023-04-02 MED ORDER — PROPOFOL 1000 MG/100ML IV EMUL
INTRAVENOUS | Status: AC
  Filled 2023-04-02: qty 100

## 2023-04-02 MED ORDER — ACETAMINOPHEN 325 MG PO TABS
325.0000 mg | ORAL_TABLET | Freq: Four times a day (QID) | ORAL | Status: DC | PRN
  Administered 2023-04-04 (×2): 650 mg via ORAL

## 2023-04-02 MED ORDER — CELECOXIB 200 MG PO CAPS
200.0000 mg | ORAL_CAPSULE | Freq: Two times a day (BID) | ORAL | Status: DC
  Administered 2023-04-02 – 2023-04-04 (×4): 200 mg via ORAL
  Filled 2023-04-02: qty 1

## 2023-04-02 MED ORDER — TRANEXAMIC ACID-NACL 1000-0.7 MG/100ML-% IV SOLN
1000.0000 mg | Freq: Once | INTRAVENOUS | Status: AC
Start: 2023-04-02 — End: 2023-04-02
  Administered 2023-04-02: 1000 mg via INTRAVENOUS

## 2023-04-02 MED ORDER — MENTHOL 3 MG MT LOZG: 1.0000 | LOZENGE | OROMUCOSAL | Status: DC | PRN

## 2023-04-02 MED ORDER — DEXAMETHASONE SODIUM PHOSPHATE 10 MG/ML IJ SOLN
8.0000 mg | Freq: Once | INTRAMUSCULAR | Status: AC
  Administered 2023-04-02: 8 mg via INTRAVENOUS

## 2023-04-02 MED ORDER — PANTOPRAZOLE SODIUM 40 MG PO TBEC
40.0000 mg | DELAYED_RELEASE_TABLET | Freq: Two times a day (BID) | ORAL | Status: DC
  Administered 2023-04-02 – 2023-04-04 (×5): 40 mg via ORAL

## 2023-04-02 MED ORDER — PHENOL 1.4 % MT LIQD: 1.0000 | OROMUCOSAL | Status: DC | PRN

## 2023-04-02 MED ORDER — HYDROMORPHONE HCL 1 MG/ML IJ SOLN: 0.5000 mg | INTRAMUSCULAR | Status: DC | PRN

## 2023-04-02 MED ORDER — SENNOSIDES-DOCUSATE SODIUM 8.6-50 MG PO TABS
ORAL_TABLET | ORAL | Status: AC
  Filled 2023-04-02: qty 1

## 2023-04-02 MED ORDER — ONDANSETRON HCL 4 MG PO TABS: 4.0000 mg | ORAL_TABLET | Freq: Four times a day (QID) | ORAL | Status: DC | PRN

## 2023-04-02 MED ORDER — SURGIPHOR WOUND IRRIGATION SYSTEM - OPTIME: TOPICAL | Status: DC | PRN

## 2023-04-02 MED ORDER — BUPIVACAINE LIPOSOME 1.3 % IJ SUSP
INTRAMUSCULAR | Status: AC
  Filled 2023-04-02: qty 20

## 2023-04-02 MED ORDER — CEFAZOLIN SODIUM-DEXTROSE 2-4 GM/100ML-% IV SOLN
2.0000 g | INTRAVENOUS | Status: AC
  Administered 2023-04-02: 2 g via INTRAVENOUS

## 2023-04-02 MED ORDER — SODIUM CHLORIDE (PF) 0.9 % IJ SOLN
INTRAMUSCULAR | Status: DC | PRN
  Administered 2023-04-02: 120 mL via INTRAMUSCULAR

## 2023-04-02 MED ORDER — FERROUS SULFATE 325 (65 FE) MG PO TABS
325.0000 mg | ORAL_TABLET | Freq: Two times a day (BID) | ORAL | Status: DC
  Administered 2023-04-02 – 2023-04-03 (×2): 325 mg via ORAL
  Filled 2023-04-02: qty 1

## 2023-04-02 MED ORDER — CHLORHEXIDINE GLUCONATE 0.12 % MT SOLN
OROMUCOSAL | Status: AC
  Filled 2023-04-02: qty 15

## 2023-04-02 MED ORDER — SENNOSIDES-DOCUSATE SODIUM 8.6-50 MG PO TABS
1.0000 | ORAL_TABLET | Freq: Two times a day (BID) | ORAL | Status: DC
  Administered 2023-04-02 – 2023-04-04 (×4): 1 via ORAL

## 2023-04-02 MED ORDER — OXYCODONE HCL 5 MG PO TABS
ORAL_TABLET | ORAL | Status: AC
  Filled 2023-04-02: qty 1

## 2023-04-02 MED ORDER — MUPIROCIN 2 % EX OINT: 1.0000 | TOPICAL_OINTMENT | Freq: Two times a day (BID) | CUTANEOUS | 0 refills | Status: AC

## 2023-04-02 MED ORDER — OXYCODONE HCL 5 MG PO TABS
5.0000 mg | ORAL_TABLET | Freq: Once | ORAL | Status: AC | PRN
  Administered 2023-04-02: 5 mg via ORAL

## 2023-04-02 MED ORDER — ACETAMINOPHEN 10 MG/ML IV SOLN
1000.0000 mg | Freq: Four times a day (QID) | INTRAVENOUS | Status: AC
  Administered 2023-04-02 – 2023-04-03 (×4): 1000 mg via INTRAVENOUS

## 2023-04-02 MED ORDER — ONDANSETRON HCL 4 MG/2ML IJ SOLN
INTRAMUSCULAR | Status: DC | PRN
  Administered 2023-04-02: 4 mg via INTRAVENOUS

## 2023-04-02 MED ORDER — SODIUM CHLORIDE FLUSH 0.9 % IV SOLN
INTRAVENOUS | Status: AC
  Filled 2023-04-02: qty 40

## 2023-04-02 MED ORDER — GABAPENTIN 300 MG PO CAPS
300.0000 mg | ORAL_CAPSULE | Freq: Once | ORAL | Status: AC
  Administered 2023-04-02: 300 mg via ORAL

## 2023-04-02 MED ORDER — ALUM & MAG HYDROXIDE-SIMETH 200-200-20 MG/5ML PO SUSP
30.0000 mL | ORAL | Status: DC | PRN
Start: 2023-04-02 — End: 2023-04-04

## 2023-04-02 MED ORDER — CHLORHEXIDINE GLUCONATE 4 % EX SOLN: 60.0000 mL | Freq: Once | CUTANEOUS | Status: DC

## 2023-04-02 MED ORDER — TRAMADOL HCL 50 MG PO TABS: 50.0000 mg | ORAL_TABLET | ORAL | Status: DC | PRN

## 2023-04-02 MED ORDER — AMLODIPINE BESYLATE 10 MG PO TABS
5.0000 mg | ORAL_TABLET | Freq: Every day | ORAL | Status: DC
  Administered 2023-04-03 – 2023-04-04 (×2): 5 mg via ORAL
  Filled 2023-04-02: qty 0.5

## 2023-04-02 MED ORDER — METOCLOPRAMIDE HCL 10 MG PO TABS
10.0000 mg | ORAL_TABLET | Freq: Three times a day (TID) | ORAL | Status: DC
  Administered 2023-04-02 – 2023-04-04 (×6): 10 mg via ORAL

## 2023-04-02 MED ORDER — ONDANSETRON HCL 4 MG/2ML IJ SOLN: 4.0000 mg | Freq: Once | INTRAMUSCULAR | Status: DC | PRN

## 2023-04-02 MED ORDER — TRANEXAMIC ACID-NACL 1000-0.7 MG/100ML-% IV SOLN
1000.0000 mg | INTRAVENOUS | Status: AC
  Administered 2023-04-02: 1000 mg via INTRAVENOUS

## 2023-04-02 MED ORDER — LIDOCAINE HCL URETHRAL/MUCOSAL 2 % EX GEL
CUTANEOUS | Status: DC | PRN
  Administered 2023-04-02: 1 via TOPICAL

## 2023-04-02 MED ORDER — CEFAZOLIN SODIUM-DEXTROSE 2-4 GM/100ML-% IV SOLN
INTRAVENOUS | Status: AC
  Filled 2023-04-02: qty 100

## 2023-04-02 MED ORDER — SODIUM CHLORIDE 0.9 % IV SOLN: INTRAVENOUS | Status: DC

## 2023-04-02 MED ORDER — ASPIRIN 81 MG PO CHEW
CHEWABLE_TABLET | ORAL | Status: AC
  Filled 2023-04-02: qty 1

## 2023-04-02 MED ORDER — DEXAMETHASONE SODIUM PHOSPHATE 10 MG/ML IJ SOLN
INTRAMUSCULAR | Status: AC
  Filled 2023-04-02: qty 1

## 2023-04-02 MED ORDER — LACTATED RINGERS IV SOLN: INTRAVENOUS | Status: DC

## 2023-04-02 MED ORDER — OXYCODONE HCL 5 MG/5ML PO SOLN: 5.0000 mg | Freq: Once | ORAL | Status: AC | PRN

## 2023-04-02 MED ORDER — FENTANYL CITRATE (PF) 100 MCG/2ML IJ SOLN
INTRAMUSCULAR | Status: AC
  Filled 2023-04-02: qty 2

## 2023-04-02 MED ORDER — PANTOPRAZOLE SODIUM 40 MG PO TBEC
DELAYED_RELEASE_TABLET | ORAL | Status: AC
  Filled 2023-04-02: qty 1

## 2023-04-02 MED ORDER — SODIUM CHLORIDE 0.9 % IR SOLN
Status: DC | PRN
  Administered 2023-04-02: 3000 mL

## 2023-04-02 MED ORDER — PHENYLEPHRINE HCL-NACL 20-0.9 MG/250ML-% IV SOLN
INTRAVENOUS | Status: DC | PRN
  Administered 2023-04-02: 25 ug/min via INTRAVENOUS

## 2023-04-02 MED ORDER — FERROUS SULFATE 325 (65 FE) MG PO TABS
ORAL_TABLET | ORAL | Status: AC
  Filled 2023-04-02: qty 1

## 2023-04-02 MED ORDER — FENTANYL CITRATE (PF) 100 MCG/2ML IJ SOLN
INTRAMUSCULAR | Status: DC | PRN
  Administered 2023-04-02 (×2): 50 ug via INTRAVENOUS

## 2023-04-02 MED ORDER — BUPIVACAINE HCL (PF) 0.25 % IJ SOLN
INTRAMUSCULAR | Status: AC
  Filled 2023-04-02: qty 60

## 2023-04-02 MED ORDER — BISACODYL 10 MG RE SUPP: 10.0000 mg | Freq: Every day | RECTAL | Status: DC | PRN

## 2023-04-02 MED ORDER — CELECOXIB 200 MG PO CAPS
400.0000 mg | ORAL_CAPSULE | Freq: Once | ORAL | Status: AC
  Administered 2023-04-02: 400 mg via ORAL

## 2023-04-02 MED ORDER — BUPIVACAINE HCL (PF) 0.5 % IJ SOLN
INTRAMUSCULAR | Status: DC | PRN
  Administered 2023-04-02: 3 mL

## 2023-04-02 MED ORDER — ENSURE PRE-SURGERY PO LIQD
296.0000 mL | Freq: Once | ORAL | Status: AC
  Administered 2023-04-02: 296 mL via ORAL
  Filled 2023-04-02: qty 296

## 2023-04-02 MED ORDER — CHLORHEXIDINE GLUCONATE 4 % EX SOLN: 1.0000 | CUTANEOUS | 1 refills | Status: AC

## 2023-04-02 MED ORDER — CEFAZOLIN SODIUM-DEXTROSE 2-4 GM/100ML-% IV SOLN
2.0000 g | Freq: Four times a day (QID) | INTRAVENOUS | Status: AC
  Administered 2023-04-02 (×2): 2 g via INTRAVENOUS

## 2023-04-02 MED ORDER — ASPIRIN 81 MG PO CHEW
81.0000 mg | CHEWABLE_TABLET | Freq: Two times a day (BID) | ORAL | Status: DC
  Administered 2023-04-02 – 2023-04-04 (×4): 81 mg via ORAL
  Filled 2023-04-02: qty 1

## 2023-04-02 MED ORDER — ONDANSETRON HCL 4 MG/2ML IJ SOLN: 4.0000 mg | Freq: Four times a day (QID) | INTRAMUSCULAR | Status: DC | PRN

## 2023-04-02 MED ORDER — OXYCODONE HCL 5 MG PO TABS: 10.0000 mg | ORAL_TABLET | ORAL | Status: DC | PRN

## 2023-04-02 MED ORDER — ACETAMINOPHEN 10 MG/ML IV SOLN
INTRAVENOUS | Status: DC | PRN
  Administered 2023-04-02: 1000 mg via INTRAVENOUS

## 2023-04-02 MED ORDER — ACETAMINOPHEN 10 MG/ML IV SOLN: 1000.0000 mg | Freq: Once | INTRAVENOUS | Status: DC | PRN

## 2023-04-02 MED ORDER — FLEET ENEMA RE ENEM: 1.0000 | ENEMA | Freq: Once | RECTAL | Status: DC | PRN

## 2023-04-02 MED ORDER — MAGNESIUM HYDROXIDE 400 MG/5ML PO SUSP
30.0000 mL | Freq: Every day | ORAL | Status: DC
  Administered 2023-04-03: 30 mL via ORAL

## 2023-04-02 MED ORDER — DIPHENHYDRAMINE HCL 12.5 MG/5ML PO ELIX
12.5000 mg | ORAL_SOLUTION | ORAL | Status: DC | PRN
Start: 2023-04-02 — End: 2023-04-04

## 2023-04-02 MED ORDER — HYDROCHLOROTHIAZIDE 12.5 MG PO TABS
12.5000 mg | ORAL_TABLET | Freq: Every day | ORAL | Status: DC
  Administered 2023-04-03 – 2023-04-04 (×2): 12.5 mg via ORAL
  Filled 2023-04-02 (×3): qty 1

## 2023-04-02 SURGICAL SUPPLY — 67 items
ATTUNE PS FEM LT SZ 7 CEM KNEE (Femur) IMPLANT
ATTUNE PSRP INSR SZ7 10 KNEE (Insert) IMPLANT
BASE TIBIAL ROT PLAT SZ 7 KNEE (Knees) IMPLANT
BATTERY INSTRU NAVIGATION (MISCELLANEOUS) ×4 IMPLANT
BIT DRILL QUICK REL 1/8 2PK SL (BIT) ×1 IMPLANT
BLADE CLIPPER SURG (BLADE) IMPLANT
BLADE SAW 70X12.5 (BLADE) ×1 IMPLANT
BLADE SAW 90X13X1.19 OSCILLAT (BLADE) ×1 IMPLANT
BLADE SAW 90X25X1.19 OSCILLAT (BLADE) ×1 IMPLANT
BONE CEMENT GENTAMICIN (Cement) ×2 IMPLANT
BRUSH SCRUB EZ PLAIN DRY (MISCELLANEOUS) ×1 IMPLANT
CEMENT BONE GENTAMICIN 40 (Cement) IMPLANT
CEMENT HV SMART SET (Cement) IMPLANT
COOLER POLAR GLACIER W/PUMP (MISCELLANEOUS) ×1 IMPLANT
CUFF TRNQT CYL 24X4X16.5-23 (TOURNIQUET CUFF) IMPLANT
CUFF TRNQT CYL 30X4X21-28X (TOURNIQUET CUFF) IMPLANT
DRAPE SHEET LG 3/4 BI-LAMINATE (DRAPES) ×1 IMPLANT
DRSG AQUACEL AG ADV 3.5X14 (GAUZE/BANDAGES/DRESSINGS) ×1 IMPLANT
DRSG MEPILEX SACRM 8.7X9.8 (GAUZE/BANDAGES/DRESSINGS) ×1 IMPLANT
DRSG TEGADERM 4X4.75 (GAUZE/BANDAGES/DRESSINGS) ×1 IMPLANT
DURAPREP 26ML APPLICATOR (WOUND CARE) ×2 IMPLANT
ELECT CAUTERY BLADE 6.4 (BLADE) ×1 IMPLANT
ELECT REM PT RETURN 9FT ADLT (ELECTROSURGICAL) ×1
ELECTRODE REM PT RTRN 9FT ADLT (ELECTROSURGICAL) ×1 IMPLANT
EVACUATOR 1/8 PVC DRAIN (DRAIN) ×1 IMPLANT
EX-PIN ORTHOLOCK NAV 4X150 (PIN) ×2 IMPLANT
GAUZE XEROFORM 1X8 LF (GAUZE/BANDAGES/DRESSINGS) ×1 IMPLANT
GLOVE BIOGEL M STRL SZ7.5 (GLOVE) ×6 IMPLANT
GLOVE SURG UNDER POLY LF SZ8 (GLOVE) ×2 IMPLANT
GOWN STRL REUS W/ TWL LRG LVL3 (GOWN DISPOSABLE) ×1 IMPLANT
GOWN STRL REUS W/ TWL XL LVL3 (GOWN DISPOSABLE) ×1 IMPLANT
GOWN TOGA ZIPPER T7+ PEEL AWAY (MISCELLANEOUS) ×1 IMPLANT
HOLDER FOLEY CATH W/STRAP (MISCELLANEOUS) ×1 IMPLANT
HOOD PEEL AWAY T7 (MISCELLANEOUS) ×1 IMPLANT
IV NS IRRIG 3000ML ARTHROMATIC (IV SOLUTION) ×1 IMPLANT
KIT TURNOVER KIT A (KITS) ×1 IMPLANT
KNIFE SCULPS 14X20 (INSTRUMENTS) ×1 IMPLANT
MANIFOLD NEPTUNE II (INSTRUMENTS) ×2 IMPLANT
NDL SPNL 20GX3.5 QUINCKE YW (NEEDLE) ×2 IMPLANT
NEEDLE SPNL 20GX3.5 QUINCKE YW (NEEDLE) ×2
PACK TOTAL KNEE (MISCELLANEOUS) ×1 IMPLANT
PAD ABD DERMACEA PRESS 5X9 (GAUZE/BANDAGES/DRESSINGS) ×2 IMPLANT
PAD ARMBOARD 7.5X6 YLW CONV (MISCELLANEOUS) ×3 IMPLANT
PAD WRAPON POLAR KNEE (MISCELLANEOUS) ×1 IMPLANT
PATELLA MEDIAL ATTUN 35MM KNEE (Knees) IMPLANT
PENCIL SMOKE EVACUATOR COATED (MISCELLANEOUS) ×1 IMPLANT
PIN DRILL FIX HALF THREAD (BIT) ×2 IMPLANT
PIN FIXATION 1/8DIA X 3INL (PIN) ×1 IMPLANT
PULSAVAC PLUS IRRIG FAN TIP (DISPOSABLE) ×1
SOLUTION IRRIG SURGIPHOR (IV SOLUTION) ×1 IMPLANT
SPONGE DRAIN TRACH 4X4 STRL 2S (GAUZE/BANDAGES/DRESSINGS) ×1 IMPLANT
STAPLER SKIN PROX 35W (STAPLE) ×1 IMPLANT
STOCKINETTE STRL BIAS CUT 8X4 (MISCELLANEOUS) ×1 IMPLANT
STRAP TIBIA SHORT (MISCELLANEOUS) ×1 IMPLANT
SUCTION TUBE FRAZIER 10FR DISP (SUCTIONS) ×1 IMPLANT
SUT VIC AB 0 CT1 36 (SUTURE) ×1 IMPLANT
SUT VIC AB 1 CT1 36 (SUTURE) ×2 IMPLANT
SUT VIC AB 2-0 CT2 27 (SUTURE) ×1 IMPLANT
SYR 30ML LL (SYRINGE) ×2 IMPLANT
TIBIAL BASE ROT PLAT SZ 7 KNEE (Knees) ×1 IMPLANT
TIP FAN IRRIG PULSAVAC PLUS (DISPOSABLE) ×1 IMPLANT
TOWEL OR 17X26 4PK STRL BLUE (TOWEL DISPOSABLE) ×1 IMPLANT
TOWER CARTRIDGE SMART MIX (DISPOSABLE) ×1 IMPLANT
TRAP FLUID SMOKE EVACUATOR (MISCELLANEOUS) ×1 IMPLANT
TRAY FOLEY MTR SLVR 16FR STAT (SET/KITS/TRAYS/PACK) ×1 IMPLANT
WATER STERILE IRR 1000ML POUR (IV SOLUTION) ×1 IMPLANT
WRAPON POLAR PAD KNEE (MISCELLANEOUS) ×1

## 2023-04-02 NOTE — Plan of Care (Signed)
   Problem: Pain Management: Goal: Pain level will decrease with appropriate interventions Outcome: Progressing   Problem: Skin Integrity: Goal: Will show signs of wound healing Outcome: Progressing

## 2023-04-02 NOTE — Op Note (Signed)
 OPERATIVE NOTE  DATE OF SURGERY:  04/02/2023  PATIENT NAME:  Jean Khan   DOB: [birth year]  MRN: [id number]  PRE-OPERATIVE DIAGNOSIS: Degenerative arthrosis of the left knee, primary  POST-OPERATIVE DIAGNOSIS:  Same  PROCEDURE:  Left total knee arthroplasty using computer-assisted navigation  SURGEON:  Lynwood SHAUNNA Mardee Mickey. M.D.  ASSISTANT:  Sidra Koyanagi, PA-C (present and scrubbed throughout the case, critical for assistance with exposure, retraction, instrumentation, and closure)  ANESTHESIA: spinal  ESTIMATED BLOOD LOSS: 50 mL  FLUIDS REPLACED: 700 mL of crystalloid  TOURNIQUET TIME: 91 minutes  DRAINS: 2 medium Hemovac drains  SOFT TISSUE RELEASES: Anterior cruciate ligament, posterior cruciate ligament, deep medial collateral ligament, patellofemoral ligament  IMPLANTS UTILIZED: DePuy Attune size 7 posterior stabilized femoral component (cemented), size 7 rotating platform tibial component (cemented), 35 mm medialized dome patella (cemented), and a 10 mm stabilized rotating platform polyethylene insert.  INDICATIONS FOR SURGERY: Jean Khan is a 78 y.o. year old female with a long history of progressive knee pain. X-rays demonstrated severe degenerative changes in tricompartmental fashion. The patient had not seen any significant improvement despite conservative nonsurgical intervention. After discussion of the risks and benefits of surgical intervention, the patient expressed understanding of the risks benefits and agree with plans for total knee arthroplasty.   The risks, benefits, and alternatives were discussed at length including but not limited to the risks of infection, bleeding, nerve injury, stiffness, blood clots, the need for revision surgery, cardiopulmonary complications, among others, and they were willing to proceed.  PROCEDURE IN DETAIL: The patient was brought into the operating room and, after adequate spinal anesthesia was achieved, a tourniquet was placed  on the patient's upper thigh. The patient's knee and leg were cleaned and prepped with alcohol  and DuraPrep and draped in the usual sterile fashion. A timeout was performed as per usual protocol. The lower extremity was exsanguinated using an Esmarch, and the tourniquet was inflated to 300 mmHg. An anterior longitudinal incision was made followed by a standard mid vastus approach. The deep fibers of the medial collateral ligament were elevated in a subperiosteal fashion off of the medial flare of the tibia so as to maintain a continuous soft tissue sleeve. The patella was subluxed laterally and the patellofemoral ligament was incised. Inspection of the knee demonstrated severe degenerative changes with full-thickness loss of articular cartilage. Osteophytes were debrided using a rongeur. Anterior and posterior cruciate ligaments were excised. Two 4.0 mm Schanz pins were inserted in the femur and into the tibia for attachment of the array of trackers used for computer-assisted navigation. Hip center was identified using a circumduction technique. Distal landmarks were mapped using the computer. The distal femur and proximal tibia were mapped using the computer. The distal femoral cutting guide was positioned using computer-assisted navigation so as to achieve a 5 distal valgus cut. The femur was sized and it was felt that a size 7 femoral component was appropriate. A size 7 femoral cutting guide was positioned and the anterior cut was performed and verified using the computer. This was followed by completion of the posterior and chamfer cuts. Femoral cutting guide for the central box was then positioned in the center box cut was performed.  Attention was then directed to the proximal tibia. Medial and lateral menisci were excised. The extramedullary tibial cutting guide was positioned using computer-assisted navigation so as to achieve a 0 varus-valgus alignment and 3 posterior slope. The cut was performed and  verified using the computer. The proximal tibia  was sized and it was felt that a size 7 tibial tray was appropriate. Tibial and femoral trials were inserted followed by insertion of a 10 mm polyethylene insert. This allowed for excellent mediolateral soft tissue balancing both in flexion and in full extension. Finally, the patella was cut and prepared so as to accommodate a 35 mm medialized dome patella. A patella trial was placed and the knee was placed through a range of motion with excellent patellar tracking appreciated. The femoral trial was removed after debridement of posterior osteophytes. The central post-hole for the tibial component was reamed followed by insertion of a keel punch. Tibial trials were then removed. Cut surfaces of bone were irrigated with copious amounts of normal saline using pulsatile lavage and then suctioned dry. Polymethylmethacrylate cement with gentamicin was prepared in the usual fashion using a vacuum mixer. Cement was applied to the cut surface of the proximal tibia as well as along the undersurface of a size 7 rotating platform tibial component. Tibial component was positioned and impacted into place. Excess cement was removed using Personal assistant. Cement was then applied to the cut surfaces of the femur as well as along the posterior flanges of the size 7 femoral component. The femoral component was positioned and impacted into place. Excess cement was removed using Personal assistant. A 10 mm polyethylene trial was inserted and the knee was brought into full extension with steady axial compression applied. Finally, cement was applied to the backside of a 35 mm medialized dome patella and the patellar component was positioned and patellar clamp applied. Excess cement was removed using Personal assistant. After adequate curing of the cement, the tourniquet was deflated after a total tourniquet time of 91 minutes. Hemostasis was achieved using electrocautery. The knee was irrigated  with copious amounts of normal saline using pulsatile lavage followed by 450 ml of Surgiphor and then suctioned dry. 20 mL of 1.3% Exparel  and 60 mL of 0.25% Marcaine  in 40 mL of normal saline was injected along the posterior capsule, medial and lateral gutters, and along the arthrotomy site. A 10 mm stabilized rotating platform polyethylene insert was inserted and the knee was placed through a range of motion with excellent mediolateral soft tissue balancing appreciated and excellent patellar tracking noted. 2 medium drains were placed in the wound bed and brought out through separate stab incisions. The medial parapatellar portion of the incision was reapproximated using interrupted sutures of #1 Vicryl. Subcutaneous tissue was approximated in layers using first #0 Vicryl followed #2-0 Vicryl. The skin was approximated with skin staples. A sterile dressing was applied.  The patient tolerated the procedure well and was transported to the recovery room in stable condition.    Kycen Spalla P. Akeila Lana, Jr., M.D.

## 2023-04-02 NOTE — Evaluation (Signed)
 Physical Therapy Evaluation Patient Details Name: MADOLINE BHATT MRN: [id number] DOB: Feb 23, 1946 Today's Date: 04/02/2023  History of Present Illness  78 y/o s/p L TKA on 04/02/23. PMH: depression, HTN, iron deficiency anemia, trigeminal neuralgia  Clinical Impression  Patient admitted s/p above procedure. PTA, patient lives with husband and utilizes rollator in the home PRN and [facility] outside of the home. Education provided to patient and family on importance of knee extension, use of polar care and bone foam, and importance of mobility. Patient required CGA for bed mobility, sit to stand, and short in room ambulation with RW. Complaining of increased pain and discomfort in L knee with mobility. Anticipate patient will progress well acutely. Patient will benefit from skilled PT services during acute stay to address listed deficits. Patient will benefit from ongoing therapy at discharge to maximize functional independence and safety.         If plan is discharge home, recommend the following: A little help with walking and/or transfers;A little help with bathing/dressing/bathroom;Assistance with cooking/housework;Assist for transportation;Help with stairs or ramp for entrance   Can travel by private vehicle        Equipment Recommendations Rolling Magalene Mclear (2 wheels)  Recommendations for Other Services       Functional Status Assessment Patient has had a recent decline in their functional status and demonstrates the ability to make significant improvements in function in a reasonable and predictable amount of time.     Precautions / Restrictions Precautions Precautions: Fall;Knee Precaution Booklet Issued: No Restrictions Weight Bearing Restrictions Per Provider Order: Yes LLE Weight Bearing Per Provider Order: Weight bearing as tolerated      Mobility  Bed Mobility Overal bed mobility: Needs Assistance Bed Mobility: Supine to Sit, Sit to Supine     Supine to sit: Contact guard, HOB  elevated Sit to supine: Contact guard assist, HOB elevated   General bed mobility comments: with increased time, patient able to move BLEs on/off bed    Transfers Overall transfer level: Needs assistance Equipment used: Rolling Lorretta Kerce (2 wheels) Transfers: Sit to/from Stand Sit to Stand: Contact guard assist           General transfer comment: cues for hand placement    Ambulation/Gait Ambulation/Gait assistance: Contact guard assist Gait Distance (Feet): 5 Feet (+10') Assistive device: Rolling Sharronda Schweers (2 wheels) Gait Pattern/deviations: Step-to pattern, Decreased stride length Gait velocity: decreased     General Gait Details: CGA for safety. Prefers to ambulate very short distance this date. Limited heel strike noted on L  Stairs            Wheelchair Mobility     Tilt Bed    [name] (Stroke Patients Only)       Balance Overall balance assessment: Needs assistance Sitting-balance support: No upper extremity supported, Feet supported Sitting balance-[name]: Good     Standing balance support: Bilateral upper extremity supported, During functional activity Standing balance-[name]: Poor Standing balance comment: reliant on RW                             Pertinent Vitals/Pain Pain Assessment Pain Assessment: 0-10 Pain Score: 6  Pain Location: L knee Pain Descriptors / Indicators: Discomfort, Grimacing, Guarding Pain Intervention(s): Limited activity within patient's tolerance, Monitored during session, Premedicated before session, Repositioned    Home Living Family/patient expects to be discharged to:: Private residence Living Arrangements: Spouse/significant other Available Help at Discharge: Family;Available 24 hours/day Type of  Home: House Home Access: Stairs to enter Entrance Stairs-Rails: Left Entrance Stairs-Number of Steps: 3   Home Layout: One level Home Equipment: Agricultural Consultant (2 wheels);Rollator (4  wheels);BSC/3in1      Prior Function Prior Level of Function : Independent/Modified Independent;Driving             Mobility Comments: PRN use of rollator 2/2 L knee pain. Uses SPC when out of the house ADLs Comments: Mod I with self care needs     Extremity/Trunk Assessment   Upper Extremity Assessment Upper Extremity Assessment: Overall WFL for tasks assessed    Lower Extremity Assessment Lower Extremity Assessment: LLE deficits/detail LLE Deficits / Details: deficits consistent with post op pain and weakness. Limited assessment 2/2 bandaging around knee    Cervical / Trunk Assessment Cervical / Trunk Assessment: Normal  Communication   Communication Communication: No apparent difficulties  Cognition Arousal: Alert Behavior During Therapy: WFL for tasks assessed/performed Overall Cognitive Status: Within Functional Limits for tasks assessed                                          General Comments      Exercises     Assessment/Plan    PT Assessment Patient needs continued PT services  PT Problem List Decreased strength;Decreased activity tolerance;Decreased balance;Decreased mobility       PT Treatment Interventions DME instruction;Gait training;Stair training;Functional mobility training;Therapeutic activities;Therapeutic exercise;Balance training;Patient/family education    PT Goals (Current goals can be found in the Care Plan section)  Acute Rehab PT Goals Patient Stated Goal: to reduce pain PT Goal Formulation: With patient/family Time For Goal Achievement: 04/16/23 Potential to Achieve Goals: Good    Frequency BID     Co-evaluation               AM-PAC PT 6 Clicks Mobility  Outcome Measure Help needed turning from your back to your side while in a flat bed without using bedrails?: A Little Help needed moving from lying on your back to sitting on the side of a flat bed without using bedrails?: A Little Help needed  moving to and from a bed to a chair (including a wheelchair)?: A Little Help needed standing up from a chair using your arms (e.g., wheelchair or bedside chair)?: A Little Help needed to walk in hospital room?: A Little Help needed climbing 3-5 steps with a railing? : A Lot 6 Click Score: 17    End of Session Equipment Utilized During Treatment: Gait belt Activity Tolerance: Patient tolerated treatment well Patient left: in bed;with call bell/phone within reach;with family/visitor present Nurse Communication: Mobility status PT Visit Diagnosis: Unsteadiness on feet (R26.81);Muscle weakness (generalized) (M62.81);Other abnormalities of gait and mobility (R26.89)    Time: 8671-8645 PT Time Calculation (min) (ACUTE ONLY): 26 min   Charges:   PT Evaluation $PT Eval Low Complexity: 1 Low   PT General Charges $$ ACUTE PT VISIT: 1 Visit         Maryanne Finder, PT, DPT Physical Therapist - [facility]  [facility]   Jonelle Bann A Jeanifer Halliday 04/02/2023, 2:50 PM

## 2023-04-02 NOTE — TOC Initial Note (Addendum)
 Transition of Care [facility]) - Initial/Assessment Note    Patient Details  Name: Jean Khan MRN: [id number] Date of Birth: [birth year]  Transition of Care [facility]) CM/SW Contact:    Royanne JINNY Bernheim, RN Phone Number: 04/02/2023, 11:21 AM  Clinical Narrative:                 The patient has HTA and [facility] is not able to accept them I reached out to [employer] to see if they can accept for [facility] services Waiting on a response from [place]  at [place]   Update, [facility] accepted the patient for [facility]     Patient Goals and CMS Choice            Expected Discharge Plan and Services                                              Prior Living Arrangements/Services                       Activities of Daily Living      Permission Sought/Granted                  Emotional Assessment              Admission diagnosis:  History of total knee arthroplasty, left [Z96.652] Patient Active Problem List   Diagnosis Date Noted   History of total knee arthroplasty, left 04/02/2023   Neurogenic claudication due to lumbar spinal stenosis 10/02/2022   S/P laminectomy 10/02/2022   Primary osteoarthritis of left knee 06/16/2022   Primary osteoarthritis of right knee 06/16/2022   Severe obesity (BMI 35.0-39.9) with comorbidity (HCC) 06/16/2022   Iron deficiency anemia 10/17/2021   UTI (urinary tract infection) 08/19/2021   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 [place] [address] Phone: [phone] Fax: [phone]     Social Drivers of Health (SDOH) Social History: SDOH Screenings   Food Insecurity: No Food Insecurity (10/02/2022)  Housing: Low Risk  (10/02/2022)  Transportation Needs: No Transportation Needs (10/02/2022)  Utilities: Not At Risk (10/02/2022)  Financial Resource Strain: Low Risk  (06/16/2021)   Received from [facility], [facility]  Tobacco Use: Low Risk  (04/02/2023)   SDOH Interventions:     Readmission Risk Interventions     No data to display

## 2023-04-02 NOTE — Anesthesia Procedure Notes (Signed)
 Spinal  Patient location during procedure: OR Start time: 04/02/2023 7:20 AM End time: 04/02/2023 7:29 AM Reason for block: surgical anesthesia Staffing Performed: resident/CRNA  Anesthesiologist: Boone Fess, MD Resident/CRNA: Norleen Alberta HERO., CRNA Performed by: Norleen Alberta HERO., CRNA Authorized by: Boone Fess, MD   Preanesthetic Checklist Completed: patient identified, IV checked, site marked, risks and benefits discussed, surgical consent, monitors and equipment checked, pre-op evaluation and timeout performed Spinal Block Patient position: sitting Prep: Betadine Patient monitoring: heart rate, continuous pulse ox, blood pressure and cardiac monitor Approach: midline Location: L2-3 Injection technique: single-shot Needle Needle type: Whitacre and Introducer  Needle gauge: 24 G Needle length: 9 cm Assessment Events: CSF return Additional Notes Negative paresthesia. Negative blood return. Positive free-flowing CSF. Expiration date of kit checked and confirmed. Patient tolerated procedure well, without complications.

## 2023-04-02 NOTE — Interval H&P Note (Signed)
 History and Physical Interval Note:  04/02/2023 6:08 AM  Jean Khan  has presented today for surgery, with the diagnosis of PRIMARY OSTEOARTHRITIS OF LEFT KNEE..  The various methods of treatment have been discussed with the patient and family. After consideration of risks, benefits and other options for treatment, the patient has consented to  Procedure(s): COMPUTER ASSISTED TOTAL KNEE ARTHROPLASTY (Left) as a surgical intervention.  The patient's history has been reviewed, patient examined, no change in status, stable for surgery.  I have reviewed the patient's chart and labs.  Questions were answered to the patient's satisfaction.     Mihran Lebarron P Keyshun Elpers

## 2023-04-02 NOTE — Progress Notes (Signed)
 Patient is not able to walk the distance required to go the bathroom, or he/she is unable to safely negotiate stairs required to access the bathroom.  A 3in1 BSC will alleviate this problem   Amenda Duclos P. Angie Fava M.D.

## 2023-04-02 NOTE — Anesthesia Preprocedure Evaluation (Signed)
 Anesthesia Evaluation  Patient identified by MRN, date of birth, ID band Patient awake    Reviewed: Allergy & Precautions, NPO status , Patient's Chart, lab work & pertinent test results  History of Anesthesia Complications Negative for: history of anesthetic complications  Airway Mallampati: III  TM Distance: >3 FB Neck ROM: Full    Dental no notable dental hx. (+) Partial Upper, Lower Dentures   Pulmonary neg pulmonary ROS, neg sleep apnea, neg COPD, Patient abstained from smoking.Not current smoker   Pulmonary exam normal breath sounds clear to auscultation       Cardiovascular Exercise Tolerance: Good METShypertension, Pt. on medications (-) CAD and (-) Past MI (-) dysrhythmias  Rhythm:Regular Rate:Normal - Systolic murmurs    Neuro/Psych  Headaches PSYCHIATRIC DISORDERS  Depression     Neuromuscular disease    GI/Hepatic ,GERD  Medicated and Controlled,,(+)     (-) substance abuse    Endo/Other  neg diabetes    Renal/GU negative Renal ROS     Musculoskeletal  (+) Arthritis ,    Abdominal   Peds  Hematology   Anesthesia Other Findings Past Medical History: No date: Arthritis No date: Back pain with sciatica No date: Bacteremia No date: Bacteroides fragilis infection No date: Colitis No date: Depression No date: Fatty liver No date: GERD (gastroesophageal reflux disease) No date: Headache No date: Hypertension No date: Iron deficiency anemia No date: Large bowel obstruction (HCC) No date: Lumbar spinal stenosis No date: Neurogenic claudication due to lumbar spinal stenosis No date: Obesity No date: Osteoarthritis of left knee No date: Primary osteoarthritis of right knee No date: Primary osteoarthritis of right knee No date: Thyroid nodule No date: Trigeminal neuralgia  Reproductive/Obstetrics                             Anesthesia Physical Anesthesia Plan  ASA:  2  Anesthesia Plan: Spinal   Post-op Pain Management: Celebrex  PO (pre-op)*, Ofirmev  IV (intra-op)* and Gabapentin  PO (pre-op)*   Induction: Intravenous  PONV Risk Score and Plan: 2 and Ondansetron , Dexamethasone , Propofol  infusion, TIVA and Midazolam   Airway Management Planned: Natural Airway  Additional Equipment: None  Intra-op Plan:   Post-operative Plan:   Informed Consent: I have reviewed the patients History and Physical, chart, labs and discussed the procedure including the risks, benefits and alternatives for the proposed anesthesia with the patient or authorized representative who has indicated his/her understanding and acceptance.       Plan Discussed with: CRNA and Surgeon  Anesthesia Plan Comments: (Discussed R/B/A of neuraxial anesthesia technique with patient: - rare risks of spinal/epidural hematoma, nerve damage, infection - Risk of PDPH - Risk of nausea and vomiting - Risk of conversion to general anesthesia and its associated risks, including sore throat, damage to lips/eyes/teeth/oropharynx, and rare risks such as cardiac and respiratory events. - Risk of allergic reactions  Discussed the role of CRNA in patient's perioperative care.  Patient voiced understanding.)       Anesthesia Quick Evaluation

## 2023-04-02 NOTE — Progress Notes (Signed)
 Patient awake/alert x4.  Able to move bil lower ext, able to push bilaterally.  Will be ready for PT this afternoon.  States slight discomfort left knee area "top"  medicated with oxycodone as ordered.  Pulses intact +2. Lunch provided.

## 2023-04-02 NOTE — Plan of Care (Signed)
 Reviewed with patient

## 2023-04-02 NOTE — Transfer of Care (Signed)
 Immediate Anesthesia Transfer of Care Note  Patient: Jean Khan  Procedure(s) Performed: COMPUTER ASSISTED TOTAL KNEE ARTHROPLASTY (Left: Knee)  Patient Location: PACU  Anesthesia Type:General  Level of Consciousness: drowsy and patient cooperative  Airway & Oxygen Therapy: Patient Spontanous Breathing  Post-op Assessment: Report given to RN and Post -op Vital signs reviewed and stable  Post vital signs: stable  Last Vitals:  Vitals Value Taken Time  BP 118/69 04/02/23 1105  Temp    Pulse 71 04/02/23 1108  Resp 15 04/02/23 1108  SpO2 92 % 04/02/23 1108  Vitals shown include unfiled device data.  Last Pain:  Vitals:   04/02/23 0616  TempSrc: Oral         Complications: No notable events documented.

## 2023-04-03 DIAGNOSIS — M1712 Unilateral primary osteoarthritis, left knee: Secondary | ICD-10-CM | POA: Diagnosis not present

## 2023-04-03 MED ORDER — ASPIRIN 81 MG PO CHEW
CHEWABLE_TABLET | ORAL | Status: AC
  Filled 2023-04-03: qty 1

## 2023-04-03 MED ORDER — SENNOSIDES-DOCUSATE SODIUM 8.6-50 MG PO TABS
ORAL_TABLET | ORAL | Status: AC
  Filled 2023-04-03: qty 1

## 2023-04-03 MED ORDER — OXYCODONE HCL 5 MG PO TABS
ORAL_TABLET | ORAL | Status: AC
  Filled 2023-04-03: qty 1

## 2023-04-03 MED ORDER — CELECOXIB 200 MG PO CAPS
ORAL_CAPSULE | ORAL | Status: AC
  Filled 2023-04-03: qty 1

## 2023-04-03 MED ORDER — PANTOPRAZOLE SODIUM 40 MG PO TBEC
DELAYED_RELEASE_TABLET | ORAL | Status: AC
  Filled 2023-04-03: qty 1

## 2023-04-03 MED ORDER — FERROUS SULFATE 325 (65 FE) MG PO TABS
ORAL_TABLET | ORAL | Status: AC
  Filled 2023-04-03: qty 1

## 2023-04-03 MED ORDER — HYDROCHLOROTHIAZIDE 25 MG PO TABS
ORAL_TABLET | ORAL | Status: AC
  Filled 2023-04-03: qty 1

## 2023-04-03 MED ORDER — AMLODIPINE BESYLATE 5 MG PO TABS
ORAL_TABLET | ORAL | Status: AC
Start: 2023-04-03 — End: ?
  Filled 2023-04-03: qty 1

## 2023-04-03 MED ORDER — METOCLOPRAMIDE HCL 10 MG PO TABS
ORAL_TABLET | ORAL | Status: AC
  Filled 2023-04-03: qty 1

## 2023-04-03 MED ORDER — SENNOSIDES-DOCUSATE SODIUM 8.6-50 MG PO TABS
ORAL_TABLET | ORAL | Status: AC
Start: 2023-04-03 — End: ?
  Filled 2023-04-03: qty 1

## 2023-04-03 MED ORDER — ACETAMINOPHEN 500 MG PO TABS
ORAL_TABLET | ORAL | Status: AC
  Filled 2023-04-03: qty 2

## 2023-04-03 MED ORDER — MAGNESIUM HYDROXIDE 400 MG/5ML PO SUSP
ORAL | Status: AC
  Filled 2023-04-03: qty 30

## 2023-04-03 MED ORDER — ACETAMINOPHEN 10 MG/ML IV SOLN
INTRAVENOUS | Status: AC
  Filled 2023-04-03: qty 100

## 2023-04-03 MED ORDER — ACETAMINOPHEN 10 MG/ML IV SOLN
INTRAVENOUS | Status: AC
Start: 2023-04-03 — End: ?
  Filled 2023-04-03: qty 100

## 2023-04-03 NOTE — Progress Notes (Signed)
 Subjective: 1 Day Post-Op Procedure(s) (LRB): COMPUTER ASSISTED TOTAL KNEE ARTHROPLASTY (Left) Patient reports pain as mild.   Patient seen in rounds with Dr. Mardee. Patient is well, and has had no acute complaints or problems.  Denies any CP, SOB, N/B, fevers or chills.  She does state that she has had some issues voiding her bladder.  Discussed with nursing who states that they had to do an In-N-Out catheter this morning We will start therapy today.  Plan is to go Home after hospital stay.  Objective: Vital signs in last 24 hours: Temp:  [97.2 F (36.2 C)-98.6 F (37 C)] 98.6 F (37 C) (01/09 0514) Pulse Rate:  [67-74] 70 (01/09 0514) Resp:  [13-19] 16 (01/09 0514) BP: (118-135)/(64-83) 127/73 (01/09 0514) SpO2:  [93 %-97 %] 94 % (01/09 0514)  Intake/Output from previous day:  Intake/Output Summary (Last 24 hours) at 04/03/2023 0749 Last data filed at 04/03/2023 0500 Gross per 24 hour  Intake 1491.67 ml  Output 1215 ml  Net 276.67 ml    Intake/Output this shift: No intake/output data recorded.  Labs: Recent Labs    03/31/23 1123  HGB 14.4   Recent Labs    03/31/23 1123  WBC 7.9  RBC 4.90  HCT 43.9  PLT 382   Recent Labs    03/31/23 1123  NA 136  K 3.5  CL 97*  CO2 26  BUN 18  CREATININE 1.04*  GLUCOSE 100*  CALCIUM 9.6   No results for input(s): LABPT, INR in the last 72 hours.  EXAM General - Patient is Alert, Appropriate, and Oriented Extremity - Neurologically intact ABD soft Neurovascular intact Sensation intact distally Intact pulses distally Dorsiflexion/Plantar flexion intact No cellulitis present Compartment soft Dressing - dressing C/D/I and no drainage Motor Function - intact, moving foot and toes well on exam.  JP Drain pulled without difficulty. Intact  Past Medical History:  Diagnosis Date   Arthritis    Back pain with sciatica    Bacteremia    Bacteroides fragilis infection    Colitis    Depression    Fatty liver     GERD (gastroesophageal reflux disease)    Headache    Hypertension    Iron deficiency anemia    Large bowel obstruction (HCC)    Lumbar spinal stenosis    Neurogenic claudication due to lumbar spinal stenosis    Obesity    Osteoarthritis of left knee    Primary osteoarthritis of right knee    Primary osteoarthritis of right knee    Thyroid nodule    Trigeminal neuralgia     Assessment/Plan: 1 Day Post-Op Procedure(s) (LRB): COMPUTER ASSISTED TOTAL KNEE ARTHROPLASTY (Left) Principal Problem:   History of total knee arthroplasty, left  Estimated body mass index is 35.34 kg/m as calculated from the following:   Height as of this encounter: 5' 5 (1.651 m).   Weight as of this encounter: 96.3 kg. Advance diet Up with therapy  Patient will continue to work with physical therapy to pass postoperative PT protocols, ROM and strengthening  Discussed with the patient continuing to utilize Polar Care  Patient will use bone foam in 20-30 minute intervals  Patient will wear TED hose bilaterally to help prevent DVT and clot formation  Discussed the Aquacel bandage.  This bandage will stay in place 7 days postoperatively.  Can be replaced with honeycomb bandages that will be sent home with the patient  Discussed sending the patient home with tramadol  and oxycodone  for as  needed pain management.  Patient will also be sent home with Celebrex  to help with swelling and inflammation.  Patient will take an 81 mg aspirin  twice daily for DVT prophylaxis  JP drain removed without difficulty, intact  Weight-Bearing as tolerated to left leg  Will look for patient to void bladder on her own prior to discharge home  Patient will follow-up with [facility] orthopedics in 2 weeks for staple removal and reevaluation  Fonda Koyanagi, PA-C [facility] 04/03/2023, 7:49 AM

## 2023-04-03 NOTE — Evaluation (Signed)
 Occupational Therapy Evaluation Patient Details Name: Jean Khan MRN: [id number] DOB: December 21, 1945 Today's Date: 04/03/2023   History of Present Illness 78 y/o s/p L TKA on 04/02/23. PMH: depression, HTN, iron deficiency anemia, trigeminal neuralgia   Clinical Impression   Pt seen for OT evaluation this date, POD#1 from above surgery. PTA pt is MOD I in ADL, amb with rollator or SPC, prn assist for IADLs. Pt is eager to return to PLOF with less pain and improved safety and independence. Pt an family provided education re: polar care mgt, falls prevention strategies, home/routines modifications, DME/AE for LB bathing and dressing tasks, and compression stocking mgt. Pt would benefit from skilled OT services including additional instruction in dressing techniques with or without assistive devices for dressing and bathing skills to support recall and carryover prior to discharge and ultimately to maximize safety, independence, and minimize falls risk and caregiver burden. Pt is left in chair, all needs met. OT will follow acutely.       If plan is discharge home, recommend the following: A little help with walking and/or transfers;A little help with bathing/dressing/bathroom;Assistance with cooking/housework    Functional Status Assessment  Patient has had a recent decline in their functional status and demonstrates the ability to make significant improvements in function in a reasonable and predictable amount of time.  Equipment Recommendations  None recommended by OT;Other (comment) (pt has recommended equipment)    Recommendations for Other Services       Precautions / Restrictions Precautions Precautions: Fall;Knee Restrictions Weight Bearing Restrictions Per Provider Order: Yes LLE Weight Bearing Per Provider Order: Weight bearing as tolerated      Mobility Bed Mobility               General bed mobility comments: NT in recliner pre/post session    Transfers Overall  transfer level: Needs assistance Equipment used: Rolling walker (2 wheels) Transfers: Sit to/from Stand Sit to Stand: Contact guard assist           General transfer comment: frequent vcs for technique      Balance Overall balance assessment: Needs assistance Sitting-balance support: No upper extremity supported, Feet supported Sitting balance-[name]: Good     Standing balance support: Bilateral upper extremity supported, During functional activity Standing balance-[name]: Fair                             ADL either performed or assessed with clinical judgement   ADL Overall ADL's : Needs assistance/impaired Eating/Feeding: Set up;Sitting   Grooming: Sitting;Set up           Upper Body Dressing : Minimal assistance;Sitting   Lower Body Dressing: Minimal assistance;Sitting/lateral leans;Sit to/from stand Lower Body Dressing Details (indicate cue type and reason): underwear, pants, socks;educated on PRN use of AE for increased ease of LB dressing Toilet Transfer: Contact guard assist;Rolling walker (2 wheels);BSC/3in1 Toilet Transfer Details (indicate cue type and reason): bsc over toilet Toileting- Clothing Manipulation and Hygiene: Supervision/safety;Sitting/lateral lean       Functional mobility during ADLs: Supervision/safety;Contact guard assist;Rolling walker (2 wheels) (approx 15' two attempts, frequent vcs for technique)       Vision Patient Visual Report: No change from baseline       Perception         Praxis         Pertinent Vitals/Pain Pain Assessment Pain Assessment: Faces Faces Pain Scale: Hurts little more Pain Location: L knee with  movement Pain Descriptors / Indicators: Discomfort, Grimacing, Guarding Pain Intervention(s): Monitored during session, Limited activity within patient's tolerance, Repositioned     Extremity/Trunk Assessment Upper Extremity Assessment Upper Extremity Assessment: Overall WFL for tasks  assessed   Lower Extremity Assessment Lower Extremity Assessment: LLE deficits/detail LLE Deficits / Details: s/p L TKA       Communication Communication Communication: No apparent difficulties Cueing Techniques: Verbal cues   Cognition Arousal: Alert Behavior During Therapy: WFL for tasks assessed/performed Overall Cognitive Status: Within Functional Limits for tasks assessed                                       General Comments  BP 142/67, HR 84 bpm, spo2 98% after LB dressing and pt reporting feeling tired    Exercises     Shoulder Instructions      Home Living Family/patient expects to be discharged to:: Private residence Living Arrangements: Spouse/significant other Available Help at Discharge: Family;Available 24 hours/day Type of Home: House Home Access: Stairs to enter [employer] of Steps: 3 Entrance Stairs-Rails: Left Home Layout: One level     Bathroom Shower/Tub: Producer, Television/film/video: Standard     Home Equipment: Agricultural Consultant (2 wheels);Rollator (4 wheels);BSC/3in1;Shower seat          Prior Functioning/Environment Prior Level of Function : Independent/Modified Independent;Driving             Mobility Comments: PRN use of rollator household distances, uses SPC when out of the house ADLs Comments: MOD I with ADL, prn assist for IADL        OT Problem List: Decreased activity tolerance;Decreased knowledge of use of DME or AE;Impaired balance (sitting and/or standing)      OT Treatment/Interventions: Self-care/ADL training;Therapeutic exercise;Energy conservation;Patient/family education;DME and/or AE instruction;Therapeutic activities;Balance training    OT Goals(Current goals can be found in the care plan section) Acute Rehab OT Goals Patient Stated Goal: urinate OT Goal Formulation: With patient Time For Goal Achievement: 04/17/23 Potential to Achieve Goals: Good ADL Goals Pt Will Perform  Grooming: with modified independence;sitting;standing Pt Will Perform Lower Body Dressing: with modified independence;sit to/from stand Pt Will Transfer to Toilet: with modified independence;ambulating Pt Will Perform Toileting - Clothing Manipulation and hygiene: with modified independence;sitting/lateral leans;sit to/from stand  OT Frequency: Min 1X/week    Co-evaluation              AM-PAC OT 6 Clicks Daily Activity     Outcome Measure Help from another person eating meals?: None Help from another person taking care of personal grooming?: None Help from another person toileting, which includes using toliet, bedpan, or urinal?: None Help from another person bathing (including washing, rinsing, drying)?: A Little Help from another person to put on and taking off regular upper body clothing?: None Help from another person to put on and taking off regular lower body clothing?: A Little 6 Click Score: 22   End of Session Equipment Utilized During Treatment: Rolling walker (2 wheels) Nurse Communication: Mobility status  Activity Tolerance: Patient tolerated treatment well Patient left: in chair;with call bell/phone within reach;with nursing/sitter in room;with family/visitor present  OT Visit Diagnosis: Other abnormalities of gait and mobility (R26.89)                Time: 9076-9048 OT Time Calculation (min): 28 min Charges:  OT General Charges $OT Visit: 1 Visit OT Evaluation $OT  Eval Low Complexity: 1 Low Therisa Sheffield, OTD OTR/L  04/03/23, 10:42 AM

## 2023-04-03 NOTE — Progress Notes (Signed)
 PT Cancellation Note  Patient Details Name: Jean Khan MRN: [id number] DOB: 1945/07/04   Cancelled Treatment:    Reason Eval/Treat Not Completed: Patient declined, no reason specified Patient recently returned to bed from ambulating to bathroom, difficulty urinating. Per RN and patient, she has been ambulating in the hallway with family members. Patient politely declines PM therapy session this date.   Maryanne Finder, PT, DPT Physical Therapist - [place]  [facility]    Erynn Vaca A Exzavier Ruderman 04/03/2023, 2:42 PM

## 2023-04-03 NOTE — Care Management Obs Status (Signed)
 MEDICARE OBSERVATION STATUS NOTIFICATION   Patient Details  Name: Jean Khan MRN: [id number] Date of Birth: 04/29/1945   Medicare Observation Status Notification Given:  Orland Dec, CMA 04/03/2023, 11:55 AM

## 2023-04-03 NOTE — Progress Notes (Signed)
 Physical Therapy Treatment Patient Details Name: Jean Khan MRN: [id number] DOB: Apr 03, 1945 Today's Date: 04/03/2023   History of Present Illness 78 y/o s/p L TKA on 04/02/23. PMH: depression, HTN, iron deficiency anemia, trigeminal neuralgia    PT Comments  Patient supine in bed on arrival and agreeable to PT treatment session. Completed bed mobility modI with HOB elevated. Ambulated 200' with RW and supervision with cues for heel strike on L to emphasize L knee extension. Negotiated 4 stairs with L hand rail and ascending/descending sideways. Educated patient on HEP handout and frequency of completion, patient demonstrated understanding of all exercises. Discharge plan remains appropriate.   *L knee AROM: 5-102      If plan is discharge home, recommend the following: A little help with walking and/or transfers;A little help with bathing/dressing/bathroom;Assistance with cooking/housework;Assist for transportation;Help with stairs or ramp for entrance   Can travel by private vehicle        Equipment Recommendations  Jean Khan (2 wheels)    Recommendations for Other Services       Precautions / Restrictions Precautions Precautions: Fall;Knee Precaution Booklet Issued: Yes (comment) Restrictions Weight Bearing Restrictions Per Provider Order: Yes LLE Weight Bearing Per Provider Order: Weight bearing as tolerated     Mobility  Bed Mobility Overal bed mobility: Modified Independent                  Transfers Overall transfer level: Needs assistance Equipment used: Jean Khan (2 wheels) Transfers: Sit to/from Stand Sit to Stand: Supervision           General transfer comment: vcs for hand placement and controlled descent    Ambulation/Gait Ambulation/Gait assistance: Supervision Gait Distance (Feet): 200 Feet Assistive device: Jean Khan (2 wheels) Gait Pattern/deviations: Step-to pattern, Decreased stride length Gait velocity: decr      General Gait Details: supervision for safety. Cues for heel strike on the L to emphasize knee extension   Stairs Stairs: Yes Stairs assistance: Supervision Stair Management: One rail Left, Step to pattern, Sideways Number of Stairs: 4 General stair comments: supervision for negotiating sideways with L rail   Wheelchair Mobility     Tilt Bed    [name] (Stroke Patients Only)       Balance Overall balance assessment: Needs assistance Sitting-balance support: No upper extremity supported, Feet supported Sitting balance-Jean Khan Scale: Good     Standing balance support: Bilateral upper extremity supported, During functional activity Standing balance-Jean Khan Scale: Fair Standing balance comment: reliant on RW                            Cognition Arousal: Alert Behavior During Therapy: WFL for tasks assessed/performed Overall Cognitive Status: Within Functional Limits for tasks assessed                                          Exercises Total Joint Exercises Goniometric ROM: L knee flexion: 102 degrees; L knee extension: 5 degrees    General Comments General comments (skin integrity, edema, etc.): BP 142/67, HR 84 bpm, spo2 98% after LB dressing and pt reporting feeling tired      Pertinent Vitals/Pain Pain Assessment Pain Assessment: Faces Faces Pain Scale: Hurts little more Pain Location: L knee with movement Pain Descriptors / Indicators: Discomfort, Grimacing, Guarding Pain Intervention(s): Monitored during session, Limited activity within patient's tolerance, Repositioned  Home Living Family/patient expects to be discharged to:: Private residence Living Arrangements: Spouse/significant other Available Help at Discharge: Family;Available 24 hours/day Type of Home: House Home Access: Stairs to enter Entrance Stairs-Rails: Left Entrance Stairs-Number of Steps: 3   Home Layout: One level Home Equipment: Agricultural Consultant (2  wheels);Rollator (4 wheels);BSC/3in1;Shower seat      Prior Function            PT Goals (current goals can now be found in the care plan section) Acute Rehab PT Goals Patient Stated Goal: to reduce pain PT Goal Formulation: With patient/family Time For Goal Achievement: 04/16/23 Potential to Achieve Goals: Good Progress towards PT goals: Progressing toward goals    Frequency    BID      PT Plan      Co-evaluation              AM-PAC PT 6 Clicks Mobility   Outcome Measure  Help needed turning from your back to your side while in a flat bed without using bedrails?: A Little Help needed moving from lying on your back to sitting on the side of a flat bed without using bedrails?: A Little Help needed moving to and from a bed to a chair (including a wheelchair)?: A Little Help needed standing up from a chair using your arms (e.g., wheelchair or bedside chair)?: A Little Help needed to walk in hospital room?: A Little Help needed climbing 3-5 steps with a railing? : A Little 6 Click Score: 18    End of Session   Activity Tolerance: Patient tolerated treatment well Patient left: in chair;with call bell/phone within reach;with family/visitor present Nurse Communication: Mobility status PT Visit Diagnosis: Unsteadiness on feet (R26.81);Muscle weakness (generalized) (M62.81);Other abnormalities of gait and mobility (R26.89)     Time: 9147-9076 PT Time Calculation (min) (ACUTE ONLY): 31 min  Charges:    $Therapeutic Exercise: 8-22 mins $Therapeutic Activity: 8-22 mins PT General Charges $$ ACUTE PT VISIT: 1 Visit                     Jean Khan, PT, DPT Physical Therapist - [facility]    Vana Arif A Genna Casimir 04/03/2023, 11:19 AM

## 2023-04-03 NOTE — Anesthesia Postprocedure Evaluation (Signed)
 Anesthesia Post Note  Patient: Jean Khan  Procedure(s) Performed: COMPUTER ASSISTED TOTAL KNEE ARTHROPLASTY (Left: Knee)  Patient location during evaluation: Mother Baby Anesthesia Type: Spinal Level of consciousness: oriented and awake and alert Pain management: pain level controlled Vital Signs Assessment: post-procedure vital signs reviewed and stable Respiratory status: spontaneous breathing and respiratory function stable Cardiovascular status: blood pressure returned to baseline and stable Postop Assessment: no headache, no backache, no apparent nausea or vomiting and able to ambulate Anesthetic complications: no   There were no known notable events for this encounter.   Last Vitals:  Vitals:   04/03/23 0514 04/03/23 0755  BP: 127/73 127/71  Pulse: 70 65  Resp: 16 16  Temp: 37 C 36.6 C  SpO2: 94% 96%    Last Pain:  Vitals:   04/03/23 0829  TempSrc:   PainSc: 3     LLE Motor Response: Purposeful movement (04/03/23 0829)   RLE Motor Response: Purposeful movement (04/03/23 0829) RLE Sensation: Full sensation (04/03/23 0829)      [place] [employer]

## 2023-04-03 NOTE — Plan of Care (Signed)
   Problem: Activity: Goal: Ability to avoid complications of mobility impairment will improve Outcome: Progressing   Problem: Pain Management: Goal: Pain level will decrease with appropriate interventions Outcome: Progressing

## 2023-04-04 DIAGNOSIS — M1712 Unilateral primary osteoarthritis, left knee: Secondary | ICD-10-CM | POA: Diagnosis not present

## 2023-04-04 MED ORDER — METOCLOPRAMIDE HCL 10 MG PO TABS
ORAL_TABLET | ORAL | Status: AC
  Filled 2023-04-04: qty 1

## 2023-04-04 MED ORDER — HYDROCHLOROTHIAZIDE 25 MG PO TABS
ORAL_TABLET | ORAL | Status: AC
  Filled 2023-04-04: qty 1

## 2023-04-04 MED ORDER — CELECOXIB 200 MG PO CAPS
ORAL_CAPSULE | ORAL | Status: AC
  Filled 2023-04-04: qty 1

## 2023-04-04 MED ORDER — FERROUS SULFATE 325 (65 FE) MG PO TABS
ORAL_TABLET | ORAL | Status: AC
  Filled 2023-04-04: qty 1

## 2023-04-04 MED ORDER — MAGNESIUM HYDROXIDE 400 MG/5ML PO SUSP
ORAL | Status: AC
  Filled 2023-04-04: qty 30

## 2023-04-04 MED ORDER — OXYCODONE HCL 5 MG PO TABS: 5.0000 mg | ORAL_TABLET | ORAL | 0 refills | Status: DC | PRN

## 2023-04-04 MED ORDER — CELECOXIB 200 MG PO CAPS: 200.0000 mg | ORAL_CAPSULE | Freq: Two times a day (BID) | ORAL | 1 refills | Status: DC

## 2023-04-04 MED ORDER — ASPIRIN 81 MG PO CHEW: 81.0000 mg | CHEWABLE_TABLET | Freq: Two times a day (BID) | ORAL | Status: AC

## 2023-04-04 MED ORDER — PANTOPRAZOLE SODIUM 40 MG PO TBEC
DELAYED_RELEASE_TABLET | ORAL | Status: AC
  Filled 2023-04-04: qty 1

## 2023-04-04 MED ORDER — ASPIRIN 81 MG PO CHEW
CHEWABLE_TABLET | ORAL | Status: AC
  Filled 2023-04-04: qty 1

## 2023-04-04 MED ORDER — OXYCODONE HCL 5 MG PO TABS
ORAL_TABLET | ORAL | Status: AC
  Filled 2023-04-04: qty 1

## 2023-04-04 MED ORDER — SENNOSIDES-DOCUSATE SODIUM 8.6-50 MG PO TABS
ORAL_TABLET | ORAL | Status: AC
  Filled 2023-04-04: qty 1

## 2023-04-04 MED ORDER — AMLODIPINE BESYLATE 5 MG PO TABS
ORAL_TABLET | ORAL | Status: AC
  Filled 2023-04-04: qty 1

## 2023-04-04 MED ORDER — ACETAMINOPHEN 325 MG PO TABS
ORAL_TABLET | ORAL | Status: AC
  Filled 2023-04-04: qty 2

## 2023-04-04 MED ORDER — TRAMADOL HCL 50 MG PO TABS: 50.0000 mg | ORAL_TABLET | ORAL | 0 refills | Status: DC | PRN

## 2023-04-04 MED ORDER — ACETAMINOPHEN 325 MG PO TABS
ORAL_TABLET | ORAL | Status: AC
Start: 2023-04-04 — End: ?
  Filled 2023-04-04: qty 2

## 2023-04-04 NOTE — Discharge Summary (Signed)
 Physician Discharge Summary  Subjective: 2 Days Post-Op Procedure(s) (LRB): COMPUTER ASSISTED TOTAL KNEE ARTHROPLASTY (Left) Patient reports pain as mild.   Patient seen in rounds with Dr. Mardee. Patient is well, and has had no acute complaints or problems.  Denies any CP, SOB, N/V, fevers or chills.  She does state that she has had continued issues with voiding her bladder even after performing a void trial yesterday. Plan to discharge with foley and follow up with urology outpatient.  Continue with therapy today.  Patient is ready to go home  Physician Discharge Summary  Patient ID: Jean Khan MRN: [id number] DOB/AGE: Jul 07, 1945 77 y.o.  Admit date: 04/02/2023 Discharge date: 04/04/2023  Admission Diagnoses:  Discharge Diagnoses:  Principal Problem:   History of total knee arthroplasty, left   Discharged Condition: good  Hospital Course: Patient presented to the hospital on 04/02/2023 for an elective left total knee arthroplasty performed by Dr. Mardee. Patient was given 1g of TXA and 2g of Ancef  prior to the procedure. she tolerated the procedure well without any complications. See procedural note below for details. Postoperatively, the patient did very well. she was able to pass PT protocols on post-op day one without any issues. JP drain was removed without any difficulty and was intact.  She did have difficulty voiding her bladder by herself, even after multiple void trials attempted.  She was given a Foley catheter and discharged home with this, instructed to follow-up with outpatient urology.  Physical exam was otherwise unremarkable. she denies any SOB, CP, N/V, fevers or chills. Vital signs are stable. Patient is stable to discharge home.  PROCEDURE:  Left total knee arthroplasty using computer-assisted navigation   SURGEON:  Lynwood SHAUNNA Mardee Mickey. M.D.   ASSISTANT:  Sidra Koyanagi, PA-C (present and scrubbed throughout the case, critical for assistance with exposure, retraction,  instrumentation, and closure)   ANESTHESIA: spinal   ESTIMATED BLOOD LOSS: 50 mL   FLUIDS REPLACED: 700 mL of crystalloid   TOURNIQUET TIME: 91 minutes   DRAINS: 2 medium Hemovac drains   SOFT TISSUE RELEASES: Anterior cruciate ligament, posterior cruciate ligament, deep medial collateral ligament, patellofemoral ligament   IMPLANTS UTILIZED: DePuy Attune size 7 posterior stabilized femoral component (cemented), size 7 rotating platform tibial component (cemented), 35 mm medialized dome patella (cemented), and a 10 mm stabilized rotating platform polyethylene insert.   Treatments: None  Discharge Exam: Blood pressure 135/67, pulse 69, temperature 98 F (36.7 C), temperature source Oral, resp. rate 18, height 5' 5 (1.651 m), weight 96.3 kg, SpO2 97%.   Disposition: Home   Allergies as of 04/04/2023   No Known Allergies      Medication List     TAKE these medications    acetaminophen  500 MG tablet Commonly known as: TYLENOL  Take 500 mg by mouth every 6 (six) hours as needed.   amLODipine  5 MG tablet Commonly known as: NORVASC  Take 5 mg by mouth daily.   aspirin  81 MG chewable tablet Chew 1 tablet (81 mg total) by mouth 2 (two) times daily.   celecoxib  200 MG capsule Commonly known as: CELEBREX  Take 1 capsule (200 mg total) by mouth 2 (two) times daily.   chlorhexidine  4 % external liquid Commonly known as: HIBICLENS  Apply 15 mLs (1 Application total) topically as directed for 30 doses. Use as directed daily for 5 days every other week for 6 weeks.   diphenhydrAMINE  25 mg capsule Commonly known as: BENADRYL  Take 25 mg by mouth at bedtime as needed for  sleep.   hydrochlorothiazide  12.5 MG capsule Commonly known as: MICROZIDE  Take 12.5 mg by mouth daily.   lansoprazole 15 MG capsule Commonly known as: PREVACID Take 15 mg by mouth daily as needed (Heartburn).   mupirocin  ointment 2 % Commonly known as: BACTROBAN  Place 1 Application into the nose 2 (two)  times daily for 60 doses. Use as directed 2 times daily for 5 days every other week for 6 weeks.   oxyCODONE  5 MG immediate release tablet Commonly known as: Oxy IR/ROXICODONE  Take 1 tablet (5 mg total) by mouth every 4 (four) hours as needed for moderate pain (pain score 4-6) (pain score 4-6).   traMADol  50 MG tablet Commonly known as: ULTRAM  Take 1-2 tablets (50-100 mg total) by mouth every 4 (four) hours as needed for moderate pain (pain score 4-6).               Durable Medical Equipment  (From admission, onward)           Start     Ordered   04/02/23 1109  DME Walker rolling  Once       Question:  Patient needs a walker to treat with the following condition  Answer:  Total knee replacement status   04/02/23 1108   04/02/23 1109  DME Bedside commode  Once       Comments: Patient is not able to walk the distance required to go the bathroom, or he/she is unable to safely negotiate stairs required to access the bathroom.  A 3in1 BSC will alleviate this problem  Question:  Patient needs a bedside commode to treat with the following condition  Answer:  Total knee replacement status   04/02/23 1108            Follow-up Information     Drake Chew, PA-C Follow up on 04/17/2023.   Specialty: Orthopedic Surgery Why: at 9:45am Contact information: [address] [place] [address] [phone]         Mardee Lynwood SQUIBB, MD Follow up on 05/15/2023.   Specialty: Orthopedic Surgery Why: at 2:15pm Contact information: [address] [facility] [place] [address] [phone]         Twylla Glendia BROCKS, MD Follow up.   Specialty: Urology Why: 05/08/2023 @ 8:00 am and 3:00 pm Contact information: [address] [place] [address] [phone]                 Signed: Sidra Drake 04/04/2023, 10:40 AM   Objective: Vital signs in last 24 hours: Temp:  [97.6 F (36.4 C)-98 F (36.7 C)] 98 F (36.7  C) (01/10 0735) Pulse Rate:  [69-84] 69 (01/10 0735) Resp:  [16-18] 18 (01/10 0735) BP: (133-153)/(67-95) 135/67 (01/10 0735) SpO2:  [94 %-100 %] 97 % (01/10 0735)  Intake/Output from previous day:  Intake/Output Summary (Last 24 hours) at 04/04/2023 1040 Last data filed at 04/04/2023 0527 Gross per 24 hour  Intake --  Output 3200 ml  Net -3200 ml    Intake/Output this shift: No intake/output data recorded.  Labs: No results for input(s): HGB in the last 72 hours. No results for input(s): WBC, RBC, HCT, PLT in the last 72 hours. No results for input(s): NA, K, CL, CO2, BUN, CREATININE, GLUCOSE, CALCIUM in the last 72 hours. No results for input(s): LABPT, INR in the last 72 hours.  EXAM: General - Patient is Alert, Appropriate, and Oriented Extremity - Neurologically intact ABD soft Neurovascular intact  Sensation intact distally Intact pulses distally Dorsiflexion/Plantar flexion intact No cellulitis present Compartment soft Dressing - dressing C/D/I and no drainage Motor Function - intact, moving foot and toes well on exam. Neurovascularly intact to all dermatomes down left lower extremity. JP Drain pulled without difficulty. Intact  Assessment/Plan: 2 Days Post-Op Procedure(s) (LRB): COMPUTER ASSISTED TOTAL KNEE ARTHROPLASTY (Left) Procedure(s) (LRB): COMPUTER ASSISTED TOTAL KNEE ARTHROPLASTY (Left) Past Medical History:  Diagnosis Date   Arthritis    Back pain with sciatica    Bacteremia    Bacteroides fragilis infection    Colitis    Depression    Fatty liver    GERD (gastroesophageal reflux disease)    Headache    Hypertension    Iron deficiency anemia    Large bowel obstruction (HCC)    Lumbar spinal stenosis    Neurogenic claudication due to lumbar spinal stenosis    Obesity    Osteoarthritis of left knee    Primary osteoarthritis of right knee    Primary osteoarthritis of right knee    Thyroid nodule    Trigeminal  neuralgia    Principal Problem:   History of total knee arthroplasty, left  Estimated body mass index is 35.34 kg/m as calculated from the following:   Height as of this encounter: 5' 5 (1.651 m).   Weight as of this encounter: 96.3 kg.  Patient will continue to work with physical therapy at home.  TOC has found an at home physical therapy company to work with this patient - [facility]   Discussed with the patient continuing to utilize [employer]   Patient will use bone foam in 20-30 minute intervals   Patient will wear TED hose bilaterally to help prevent DVT and clot formation   Discussed the Aquacel bandage.  This bandage will stay in place 7 days postoperatively.  Can be replaced with honeycomb bandages that will be sent home with the patient   Discussed sending the patient home with tramadol  and oxycodone  for as needed pain management.  Patient will also be sent home with Celebrex  to help with swelling and inflammation.  Patient will take an 81 mg aspirin  twice daily for DVT prophylaxis   JP drain removed without difficulty, intact   Weight-Bearing as tolerated to left leg   Patient has not been able to void her bladder, will plan to reinsert a foley catheter prior to discharge and have patient follow up with outpatient urology.   Patient will follow-up with [facility] clinic orthopedics in 2 weeks for staple removal and reevaluation  Diet - Regular diet Follow up - in 2 weeks Activity - WBAT Disposition - Home Condition Upon Discharge - Good DVT Prophylaxis - Aspirin  and TED hose  Fonda CHARLENA Koyanagi, PA-C Orthopaedic Surgery 04/04/2023, 10:40 AM

## 2023-04-04 NOTE — Plan of Care (Signed)
 Documented

## 2023-04-04 NOTE — Progress Notes (Signed)
 Pt unable to void, MD Hooten notified, orders received to insert foley.  500 ml, drained.

## 2023-04-04 NOTE — Progress Notes (Signed)
 Patient refused ferrous sulfate due to risk of constipation. Discussed with patient that stool softeners have been ordered to prevent constipation and discussed with patient reason for ferrous sulfate order. Patient continued to refuse medication.

## 2023-04-04 NOTE — Progress Notes (Signed)
 Subjective: 2 Days Post-Op Procedure(s) (LRB): COMPUTER ASSISTED TOTAL KNEE ARTHROPLASTY (Left) Patient reports pain as mild.   Patient seen in rounds with Dr. Mardee. Patient is well, and has had no acute complaints or problems.  Denies any CP, SOB, N/V, fevers or chills.  She does state that she has had continued issues with voiding her bladder even after performing a void trial yesterday. Plan to discharge with foley and follow up with urology outpatient.  Continue with therapy today.  Plan is to go Home after hospital stay.  Objective: Vital signs in last 24 hours: Temp:  [97.6 F (36.4 C)-98 F (36.7 C)] 98 F (36.7 C) (01/10 0735) Pulse Rate:  [69-84] 69 (01/10 0735) Resp:  [16-18] 18 (01/10 0735) BP: (133-153)/(67-95) 135/67 (01/10 0735) SpO2:  [94 %-100 %] 97 % (01/10 0735)  Intake/Output from previous day:  Intake/Output Summary (Last 24 hours) at 04/04/2023 1035 Last data filed at 04/04/2023 0527 Gross per 24 hour  Intake --  Output 3200 ml  Net -3200 ml    Intake/Output this shift: No intake/output data recorded.  Labs: No results for input(s): HGB in the last 72 hours.  No results for input(s): WBC, RBC, HCT, PLT in the last 72 hours.  No results for input(s): NA, K, CL, CO2, BUN, CREATININE, GLUCOSE, CALCIUM in the last 72 hours.  No results for input(s): LABPT, INR in the last 72 hours.  EXAM General - Patient is Alert, Appropriate, and Oriented Extremity - Neurologically intact ABD soft Neurovascular intact Sensation intact distally Intact pulses distally Dorsiflexion/Plantar flexion intact No cellulitis present Compartment soft Dressing - dressing C/D/I and no drainage Motor Function - intact, moving foot and toes well on exam. Neurovascularly intact to all dermatomes down left lower extremity. JP Drain pulled without difficulty. Intact  Past Medical History:  Diagnosis Date   Arthritis    Back pain with sciatica     Bacteremia    Bacteroides fragilis infection    Colitis    Depression    Fatty liver    GERD (gastroesophageal reflux disease)    Headache    Hypertension    Iron deficiency anemia    Large bowel obstruction (HCC)    Lumbar spinal stenosis    Neurogenic claudication due to lumbar spinal stenosis    Obesity    Osteoarthritis of left knee    Primary osteoarthritis of right knee    Primary osteoarthritis of right knee    Thyroid nodule    Trigeminal neuralgia     Assessment/Plan: 2 Days Post-Op Procedure(s) (LRB): COMPUTER ASSISTED TOTAL KNEE ARTHROPLASTY (Left) Principal Problem:   History of total knee arthroplasty, left  Estimated body mass index is 35.34 kg/m as calculated from the following:   Height as of this encounter: 5' 5 (1.651 m).   Weight as of this encounter: 96.3 kg. Advance diet Up with therapy  Patient will continue to work with physical therapy to pass postoperative PT protocols, ROM and strengthening  Discussed with the patient continuing to utilize Polar Care  Patient will use bone foam in 20-30 minute intervals  Patient will wear TED hose bilaterally to help prevent DVT and clot formation  Discussed the Aquacel bandage.  This bandage will stay in place 7 days postoperatively.  Can be replaced with honeycomb bandages that will be sent home with the patient  Discussed sending the patient home with tramadol  and oxycodone  for as needed pain management.  Patient will also be sent home with Celebrex  to  help with swelling and inflammation.  Patient will take an 81 mg aspirin  twice daily for DVT prophylaxis  JP drain removed without difficulty, intact  Weight-Bearing as tolerated to left leg  Will look for patient to void bladder on her own prior to discharge home  Patient has not been able to void her bladder, will plan to reinsert a foley prior to discharge and have patient follow up with outpatient urology.  Patient will follow-up with [facility]  clinic orthopedics in 2 weeks for staple removal and reevaluation  Fonda Koyanagi, PA-C [facility] 04/04/2023, 10:35 AM

## 2023-04-04 NOTE — Progress Notes (Signed)
 Physical Therapy Treatment Patient Details Name: Jean Khan MRN: [id number] DOB: [birth year] Today's Date: 04/04/2023   History of Present Illness 78 y/o s/p L TKA on 04/02/23. PMH: depression, HTN, iron deficiency anemia, trigeminal neuralgia    PT Comments  Patient progressing towards physical therapy goals. Session focused on increasing ambulation distance and therapeutic exercises in supine and sitting. Able to perform LAQ, quad sets, heel slides, hip abduction, and sit to stands with ease. Daughter present and encouraging. Anticipate patient will continue to progress well at home with family support. Discharge plan remains appropriate.    If plan is discharge home, recommend the following: Jean little help with walking and/or transfers;Jean little help with bathing/dressing/bathroom;Assistance with cooking/housework;Assist for transportation;Help with stairs or ramp for entrance   Can travel by private vehicle        Equipment Recommendations  Rolling Jean Khan (2 wheels)    Recommendations for Other Services       Precautions / Restrictions Precautions Precautions: Fall;Knee Precaution Booklet Issued: Yes (comment) Restrictions Weight Bearing Restrictions Per Provider Order: Yes LLE Weight Bearing Per Provider Order: Weight bearing as tolerated     Mobility  Bed Mobility Overal bed mobility: Modified Independent                  Transfers Overall transfer level: Needs assistance Equipment used: Rolling Jean Khan (2 wheels) Transfers: Sit to/from Stand Sit to Stand: Supervision                Ambulation/Gait Ambulation/Gait assistance: Supervision Gait Distance (Feet): 200 Feet Assistive device: Rolling Jean Khan (2 wheels) Gait Pattern/deviations: Step-to pattern, Decreased stride length Gait velocity: decr     General Gait Details: supervision for safety. Cues for heel strike on the L to emphasize knee extension   Stairs             Wheelchair  Mobility     Tilt Bed    [name] (Stroke Patients Only)       Balance Overall balance assessment: Mild deficits observed, not formally tested                                          Cognition Arousal: Alert Behavior During Therapy: WFL for tasks assessed/performed Overall Cognitive Status: Within Functional Limits for tasks assessed                                          Exercises Total Joint Exercises Quad Sets: AROM, Left, 10 reps, Supine Heel Slides: Left, 10 reps, Supine Hip ABduction/ADduction: Left, 10 reps, Supine Long Arc Quad: Left, 10 reps, Supine (2 sets)    General Comments        Pertinent Vitals/Pain Pain Assessment Pain Assessment: Faces Faces Pain Scale: Hurts little more Pain Location: L knee with movement Pain Descriptors / Indicators: Discomfort, Grimacing, Guarding Pain Intervention(s): Limited activity within patient's tolerance, Monitored during session, Repositioned    Home Living                          Prior Function            PT Goals (current goals can now be found in the care plan section) Acute Rehab PT Goals Patient Stated Goal: to reduce pain  PT Goal Formulation: With patient/family Time For Goal Achievement: 04/16/23 Potential to Achieve Goals: Good Progress towards PT goals: Progressing toward goals    Frequency    BID      PT Plan      Co-evaluation              AM-PAC PT 6 Clicks Mobility   Outcome Measure  Help needed turning from your back to your side while in Jean flat bed without using bedrails?: None Help needed moving from lying on your back to sitting on the side of Jean flat bed without using bedrails?: None Help needed moving to and from Jean bed to Jean chair (including Jean wheelchair)?: Jean Little Help needed standing up from Jean chair using your arms (e.g., wheelchair or bedside chair)?: Jean Little Help needed to walk in hospital room?: Jean Little Help  needed climbing 3-5 steps with Jean railing? : Jean Little 6 Click Score: 20    End of Session   Activity Tolerance: Patient tolerated treatment well Patient left: in chair;with call bell/phone within reach;with family/visitor present Nurse Communication: Mobility status PT Visit Diagnosis: Unsteadiness on feet (R26.81);Muscle weakness (generalized) (M62.81);Other abnormalities of gait and mobility (R26.89)     Time: 9195-9171 PT Time Calculation (min) (ACUTE ONLY): 24 min  Charges:    $Therapeutic Exercise: 8-22 mins $Therapeutic Activity: 8-22 mins PT General Charges $$ ACUTE PT VISIT: 1 Visit                     Jean Khan, PT, DPT Physical Therapist - [facility]  [facility]    Jean Khan Jean Khan 04/04/2023, 9:12 AM

## 2023-04-06 DIAGNOSIS — Z7982 Long term (current) use of aspirin: Secondary | ICD-10-CM | POA: Diagnosis not present

## 2023-04-06 DIAGNOSIS — Z79891 Long term (current) use of opiate analgesic: Secondary | ICD-10-CM | POA: Diagnosis not present

## 2023-04-06 DIAGNOSIS — K219 Gastro-esophageal reflux disease without esophagitis: Secondary | ICD-10-CM | POA: Diagnosis not present

## 2023-04-06 DIAGNOSIS — E669 Obesity, unspecified: Secondary | ICD-10-CM | POA: Diagnosis not present

## 2023-04-06 DIAGNOSIS — Z466 Encounter for fitting and adjustment of urinary device: Secondary | ICD-10-CM | POA: Diagnosis not present

## 2023-04-06 DIAGNOSIS — Z6835 Body mass index (BMI) 35.0-35.9, adult: Secondary | ICD-10-CM | POA: Diagnosis not present

## 2023-04-06 DIAGNOSIS — I10 Essential (primary) hypertension: Secondary | ICD-10-CM | POA: Diagnosis not present

## 2023-04-06 DIAGNOSIS — Z96652 Presence of left artificial knee joint: Secondary | ICD-10-CM | POA: Diagnosis not present

## 2023-04-06 DIAGNOSIS — Z471 Aftercare following joint replacement surgery: Secondary | ICD-10-CM | POA: Diagnosis not present

## 2023-04-09 ENCOUNTER — Ambulatory Visit (INDEPENDENT_AMBULATORY_CARE_PROVIDER_SITE_OTHER): Payer: Self-pay | Admitting: Urology

## 2023-04-09 ENCOUNTER — Ambulatory Visit: Payer: Self-pay | Admitting: Urology

## 2023-04-09 ENCOUNTER — Encounter: Payer: Self-pay | Admitting: Urology

## 2023-04-09 VITALS — BP 139/82 | HR 86 | Ht 65.0 in | Wt 212.0 lb

## 2023-04-09 DIAGNOSIS — R399 Unspecified symptoms and signs involving the genitourinary system: Secondary | ICD-10-CM

## 2023-04-09 DIAGNOSIS — Z466 Encounter for fitting and adjustment of urinary device: Secondary | ICD-10-CM

## 2023-04-09 LAB — BLADDER SCAN AMB NON-IMAGING: PVR: 554 WU

## 2023-04-09 MED ORDER — CEPHALEXIN 250 MG PO CAPS
500.0000 mg | ORAL_CAPSULE | Freq: Once | ORAL | Status: AC
  Administered 2023-04-09: 500 mg via ORAL

## 2023-04-09 NOTE — Progress Notes (Signed)
 Jean Khan and her daughter, Jean Khan,  returned this afternoon for PVR.  Her PVR was 554 cc, but she is not uncomfortable.  She states that she drink a lot of fluids throughout the day.  She feels if she is given more time she can probably void on her own.  She is comfortable with self catheterization and I have given her several samples to use at home.  She will self cath 1-3 times daily depending on her ability to void on her own, residual volumes and if she should experience any suprapubic pain or feelings of bladder fullness.  Reviewed return to clinic symptoms.  She is scheduled for a 4-week follow-up with Dr. Estanislao Heimlich.

## 2023-04-09 NOTE — Progress Notes (Signed)
 Patient ID: Jean Khan, female   DOB: 28-Feb-1946, 78 y.o.   MRN: [id number] Catheter Removal  Patient is present today for a catheter removal.  8ml of water was drained from the balloon. A 16FR foley cath was removed from the bladder, no complications were noted. Patient tolerated well.  Performed by: Isac Maples, CMA  Follow up/ Additional notes: Return for RTC with me 4 to 6 weeks PVR.

## 2023-04-09 NOTE — Progress Notes (Signed)
 04/09/23 8:56 AM   Jean Khan 21-Jan-1946 [id number]  CC: Urinary retention, Foley catheter management  HPI: 78 year old female who recently underwent left knee arthroplasty on 04/02/2023 with Dr. Aubry Blase.  She reportedly had elevated PVRs postoperatively and a Foley catheter was placed with urology follow-up.  Notably, she has a long history of urinary symptoms that started after a complex delivery in 1980.  She reports she voids very rarely during the day since that time and typically voids twice overnight.  She actually was performing intermittent catheterization for few years in the 1980s, but she has spontaneously voided since that time.  She has not had problems with recurrent UTIs.  CT abdomen and pelvis from 2022 shows no hydronephrosis and some mild bladder distention.  Renal function has been normal.  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  She does not have any significant incontinence.   PMH: Past Medical History:  Diagnosis Date   Arthritis    Back pain with sciatica    Bacteremia    Bacteroides fragilis infection    Colitis    Depression    Fatty liver    GERD (gastroesophageal reflux disease)    Headache    Hypertension    Iron deficiency anemia    Large bowel obstruction (HCC)    Lumbar spinal stenosis    Neurogenic claudication due to lumbar spinal stenosis    Obesity    Osteoarthritis of left knee    Primary osteoarthritis of right knee    Primary osteoarthritis of right knee    Thyroid nodule    Trigeminal neuralgia     Surgical History: Past Surgical History:  Procedure Laterality Date   APPENDECTOMY     CATARACT EXTRACTION W/PHACO Right 06/18/2022   Procedure: CATARACT EXTRACTION PHACO AND INTRAOCULAR LENS PLACEMENT (IOC) RIGHT  6.10  00:44.6;  Surgeon: Clair Crews, MD;  Location: [facility];  Service: Ophthalmology;  Laterality: Right;   CATARACT EXTRACTION W/PHACO Left 07/09/2022    Procedure: CATARACT EXTRACTION PHACO AND INTRAOCULAR LENS PLACEMENT (IOC) LEFT 6.30 00:33.4;  Surgeon: Clair Crews, MD;  Location: [facility];  Service: Ophthalmology;  Laterality: Left;   CESAREAN SECTION     COLECTOMY     COLONOSCOPY     COLOSTOMY     COLOSTOMY TAKEDOWN     08/09/2021   KNEE ARTHROPLASTY Left 04/02/2023   Procedure: COMPUTER ASSISTED TOTAL KNEE ARTHROPLASTY;  Surgeon: Arlyne Lame, MD;  Location: [facility];  Service: Orthopedics;  Laterality: Left;   laparoscopic enterolysis     laparoscopic sigmoid resection     laparoscopic splenix flexure resection     LUMBAR LAMINECTOMY/DECOMPRESSION MICRODISCECTOMY N/A 10/02/2022   Procedure: L3-S1 DECOMPRESSION;  Surgeon: Jodeen Munch, MD;  Location: [facility];  Service: Neurosurgery;  Laterality: N/A;    Family History: Family History  Problem Relation Age of Onset   Ovarian cancer Daughter 37    Social History:  reports that she has never smoked. She has never been exposed to tobacco smoke. She has never used smokeless tobacco. She reports current alcohol  use of about 1.0 standard drink of alcohol  per week. She reports that she does not use drugs.  Physical Exam: BP 139/82   Pulse 86   Ht 5\' 5"  (1.651 m)   Wt 212 lb (96.2 kg)   BMI 35.28 kg/m    Constitutional: In wheelchair Cardiovascular: No clubbing, cyanosis, or edema. Respiratory: Normal respiratory effort, no increased work of breathing. GI:  Abdomen is soft, nontender, nondistended, no abdominal masses   Laboratory Data: Reviewed, see HPI, normal renal function  Pertinent Imaging: I have personally viewed and interpreted the CT abdomen and pelvis from April 2022 showing mild bladder distention, no hydronephrosis.  Assessment & Plan:   78 year old female with recent knee replacement and elevated PVRs postoperatively prompting Foley placement and urology follow-up.  She never had abdominal discomfort consistent with true urinary retention.   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  We discussed the concept of chronic incomplete emptying/atonic bladder and as long as she is not getting recurrent infections, having lower abdominal discomfort, or having renal dysfunction, this can be safely monitored.  I did provide her with some catheters in case she develops a retention with lower abdominal discomfort and she would be able to empty her bladder, she is comfortable doing this as she catheterized multiple times in the past.  -Keflex  given for prophylaxis, Foley catheter removed, will return for PVR this afternoon-> anticipate this will be elevated with her history consistent with chronic incomplete bladder emptying and can be safely monitored, return precautions were discussed at length, she was also provided catheters for self-catheterization if needed if she develops symptoms of true retention with lower abdominal discomfort -RTC with me 4 to 6 weeks repeat PVR  Jay Meth, MD 04/09/2023  [facility] [address], [address] [place], [place] [address] [phone]

## 2023-04-13 DIAGNOSIS — Z471 Aftercare following joint replacement surgery: Secondary | ICD-10-CM | POA: Diagnosis not present

## 2023-04-17 DIAGNOSIS — M25562 Pain in left knee: Secondary | ICD-10-CM | POA: Diagnosis not present

## 2023-04-17 DIAGNOSIS — Z96652 Presence of left artificial knee joint: Secondary | ICD-10-CM | POA: Diagnosis not present

## 2023-04-21 DIAGNOSIS — M25562 Pain in left knee: Secondary | ICD-10-CM | POA: Diagnosis not present

## 2023-04-21 DIAGNOSIS — Z96652 Presence of left artificial knee joint: Secondary | ICD-10-CM | POA: Diagnosis not present

## 2023-04-24 DIAGNOSIS — I10 Essential (primary) hypertension: Secondary | ICD-10-CM | POA: Diagnosis not present

## 2023-04-28 DIAGNOSIS — M25562 Pain in left knee: Secondary | ICD-10-CM | POA: Diagnosis not present

## 2023-04-28 DIAGNOSIS — Z96652 Presence of left artificial knee joint: Secondary | ICD-10-CM | POA: Diagnosis not present

## 2023-04-30 ENCOUNTER — Ambulatory Visit: Payer: PPO | Admitting: Urology

## 2023-04-30 DIAGNOSIS — M25562 Pain in left knee: Secondary | ICD-10-CM | POA: Diagnosis not present

## 2023-04-30 DIAGNOSIS — Z1231 Encounter for screening mammogram for malignant neoplasm of breast: Secondary | ICD-10-CM | POA: Diagnosis not present

## 2023-04-30 DIAGNOSIS — Z0000 Encounter for general adult medical examination without abnormal findings: Secondary | ICD-10-CM | POA: Diagnosis not present

## 2023-04-30 DIAGNOSIS — K760 Fatty (change of) liver, not elsewhere classified: Secondary | ICD-10-CM | POA: Diagnosis not present

## 2023-04-30 DIAGNOSIS — F339 Major depressive disorder, recurrent, unspecified: Secondary | ICD-10-CM | POA: Diagnosis not present

## 2023-04-30 DIAGNOSIS — D509 Iron deficiency anemia, unspecified: Secondary | ICD-10-CM | POA: Diagnosis not present

## 2023-04-30 DIAGNOSIS — I10 Essential (primary) hypertension: Secondary | ICD-10-CM | POA: Diagnosis not present

## 2023-04-30 DIAGNOSIS — Z0001 Encounter for general adult medical examination with abnormal findings: Secondary | ICD-10-CM | POA: Diagnosis not present

## 2023-04-30 DIAGNOSIS — Z96652 Presence of left artificial knee joint: Secondary | ICD-10-CM | POA: Diagnosis not present

## 2023-05-08 ENCOUNTER — Ambulatory Visit: Payer: Self-pay | Admitting: Urology

## 2023-05-14 ENCOUNTER — Ambulatory Visit: Payer: PPO | Admitting: Urology

## 2023-05-14 ENCOUNTER — Ambulatory Visit: Payer: Self-pay | Admitting: Urology

## 2023-05-23 DIAGNOSIS — Z96652 Presence of left artificial knee joint: Secondary | ICD-10-CM | POA: Diagnosis not present

## 2023-06-02 NOTE — Progress Notes (Unsigned)
 Referring Physician:  Gracelyn Nurse, MD [address] [facility] [place],  [place] [address]  Primary Physician:  Gracelyn Nurse, MD  History of Present Illness: 06/04/2023 Jean Khan has a history of HTN, fatty liver, depression, obesity. She has a colostomy.  She is s/p L3-S1 decompression by Dr. Myer Haff on 10/02/22. She was doing well at her last visit with him on 11/14/22.   She had left TKA by Dr. Ernest Pine on 04/02/23.   She has more constant left sided LBP/left buttock pain into her left lateral thigh to her knee. Pain is worse with standing and walking. Better with rest. She is doing well with her left TKA and would like to be more active- back is limiting her activity. Occasional numbness. No tingling or weakness.   She does not smoke.   Bowel/Bladder Dysfunction: occasional bowel urgency. No incontinence.   Conservative measures:  Physical therapy: none  Multimodal medical therapy including regular antiinflammatories: tylenol  Injections: Done in the past  Past Surgery:  L3-S1 decompression by Dr. Myer Haff on 10/02/22  Jean Khan has no symptoms of cervical myelopathy.  The symptoms are causing a significant impact on the patient's life.   Review of Systems:  A 10 point review of systems is negative, except for the pertinent positives and negatives detailed in the HPI.  Past Medical History: Past Medical History:  Diagnosis Date   Arthritis    Back pain with sciatica    Bacteremia    Bacteroides fragilis infection    Colitis    Depression    Fatty liver    GERD (gastroesophageal reflux disease)    Headache    Hypertension    Iron deficiency anemia    Large bowel obstruction (HCC)    Lumbar spinal stenosis    Neurogenic claudication due to lumbar spinal stenosis    Obesity    Osteoarthritis of left knee    Primary osteoarthritis of right knee    Primary osteoarthritis of right knee    Thyroid nodule    Trigeminal neuralgia      Past Surgical History: Past Surgical History:  Procedure Laterality Date   APPENDECTOMY     CATARACT EXTRACTION W/PHACO Right 06/18/2022   Procedure: CATARACT EXTRACTION PHACO AND INTRAOCULAR LENS PLACEMENT (IOC) RIGHT  6.10  00:44.6;  Surgeon: Galen Manila, MD;  Location: [facility];  Service: Ophthalmology;  Laterality: Right;   CATARACT EXTRACTION W/PHACO Left 07/09/2022   Procedure: CATARACT EXTRACTION PHACO AND INTRAOCULAR LENS PLACEMENT (IOC) LEFT 6.30 00:33.4;  Surgeon: Galen Manila, MD;  Location: [facility];  Service: Ophthalmology;  Laterality: Left;   CESAREAN SECTION     COLECTOMY     COLONOSCOPY     COLOSTOMY     COLOSTOMY TAKEDOWN     08/09/2021   KNEE ARTHROPLASTY Left 04/02/2023   Procedure: COMPUTER ASSISTED TOTAL KNEE ARTHROPLASTY;  Surgeon: Donato Heinz, MD;  Location: [facility];  Service: Orthopedics;  Laterality: Left;   laparoscopic enterolysis     laparoscopic sigmoid resection     laparoscopic splenix flexure resection     LUMBAR LAMINECTOMY/DECOMPRESSION MICRODISCECTOMY N/A 10/02/2022   Procedure: L3-S1 DECOMPRESSION;  Surgeon: Venetia Night, MD;  Location: [facility];  Service: Neurosurgery;  Laterality: N/A;    Allergies: Allergies as of 06/04/2023   (No Known Allergies)    Medications: Outpatient Encounter Medications as of 06/04/2023  Medication Sig   acetaminophen (TYLENOL) 500 MG tablet Take 500 mg by mouth every 6 (  six) hours as needed.   amLODipine (NORVASC) 5 MG tablet Take 5 mg by mouth daily.   aspirin 81 MG chewable tablet Chew 1 tablet (81 mg total) by mouth 2 (two) times daily.   chlorhexidine (HIBICLENS) 4 % external liquid Apply 15 mLs (1 Application total) topically as directed for 30 doses. Use as directed daily for 5 days every other week for 6 weeks.   diphenhydrAMINE (BENADRYL) 25 mg capsule Take 25 mg by mouth at bedtime as needed for sleep.   hydrochlorothiazide (MICROZIDE) 12.5 MG capsule Take 12.5  mg by mouth daily.   lansoprazole (PREVACID) 15 MG capsule Take 15 mg by mouth daily as needed (Heartburn).   [DISCONTINUED] celecoxib (CELEBREX) 200 MG capsule Take 1 capsule (200 mg total) by mouth 2 (two) times daily. (Patient not taking: Reported on 06/04/2023)   [DISCONTINUED] oxyCODONE (OXY IR/ROXICODONE) 5 MG immediate release tablet Take 1 tablet (5 mg total) by mouth every 4 (four) hours as needed for moderate pain (pain score 4-6) (pain score 4-6). (Patient not taking: Reported on 06/04/2023)   [DISCONTINUED] traMADol (ULTRAM) 50 MG tablet Take 1-2 tablets (50-100 mg total) by mouth every 4 (four) hours as needed for moderate pain (pain score 4-6). (Patient not taking: Reported on 06/04/2023)   No facility-administered encounter medications on file as of 06/04/2023.    Social History: Social History   Tobacco Use   Smoking status: Never    Passive exposure: Never   Smokeless tobacco: Never  Vaping Use   Vaping status: Never Used  Substance Use Topics   Alcohol use: Yes    Alcohol/week: 1.0 standard drink of alcohol    Types: 1 Glasses of wine per week   Drug use: Never    Family Medical History: Family History  Problem Relation Age of Onset   Ovarian cancer Daughter 74    Physical Examination: Vitals:   06/04/23 0943  BP: 124/82      Awake, alert, oriented to person, place, and time.  Speech is clear and fluent. Fund of knowledge is appropriate.   Cranial Nerves: Pupils equal round and reactive to light.  Facial tone is symmetric.    Well healed lumbar incision. Right and left sided lower lumbar tenderness.   No abnormal lesions on exposed skin.   Strength: Side Iliopsoas Quads Hamstring PF DF EHL  R 5 5 5 5 5 5   L 5 5 5 5 5 5    Reflexes are 2+ and symmetric at the patella and achilles.    Clonus is not present.   Bilateral lower extremity sensation is intact to light touch.     Gait is normal.     Medical Decision Making  Imaging: No recent lumbar  imaging.   Assessment and Plan: Jean Khan is s/p L3-S1 decompression by Dr. Myer Haff on 10/02/22.   She has more constant left sided LBP/left buttock pain into her left lateral thigh to her knee. Pain is worse with standing and walking. Better with rest. She is doing well with her left TKA and would like to be more active- back is limiting her activity.   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   Treatment options discussed with patient and following plan made:   - MRI of lumbar spine ordered to evaluate new LBP/leg pain.  - Will get lumbar xrays with flex/ext as well.  - Depending on above results, may consider PT and/or injections.  - Will schedule phone visit to review MRI and xray  results once I get them back.   I spent a total of 20 minutes in face-to-face and non-face-to-face activities related to this patient's care today including review of outside records, review of imaging, review of symptoms, physical exam, discussion of differential diagnosis, discussion of treatment options, and documentation.   Drake Leach PA-C Dept. of Neurosurgery

## 2023-06-04 ENCOUNTER — Encounter: Payer: Self-pay | Admitting: Orthopedic Surgery

## 2023-06-04 ENCOUNTER — Ambulatory Visit (INDEPENDENT_AMBULATORY_CARE_PROVIDER_SITE_OTHER): Admitting: Orthopedic Surgery

## 2023-06-04 VITALS — BP 124/82 | Ht 65.0 in | Wt 212.0 lb

## 2023-06-04 DIAGNOSIS — Z9889 Other specified postprocedural states: Secondary | ICD-10-CM | POA: Diagnosis not present

## 2023-06-04 DIAGNOSIS — M4726 Other spondylosis with radiculopathy, lumbar region: Secondary | ICD-10-CM | POA: Diagnosis not present

## 2023-06-04 DIAGNOSIS — M4316 Spondylolisthesis, lumbar region: Secondary | ICD-10-CM | POA: Diagnosis not present

## 2023-06-04 DIAGNOSIS — M47816 Spondylosis without myelopathy or radiculopathy, lumbar region: Secondary | ICD-10-CM

## 2023-06-04 DIAGNOSIS — M5416 Radiculopathy, lumbar region: Secondary | ICD-10-CM

## 2023-06-04 NOTE — Patient Instructions (Signed)
 It was so nice to see you today. Thank you so much for coming in.    I want to get an MRI of your lower back to look into things further. We will get this approved through your insurance and [facility] will call you to schedule the appointment. Remind them to do your lower back xrays when you have the MRI.   [facility] (building with the white pillars) is located off of [address]. The address is [address], [place], [place] [address].    After you have the MRI and xrays, it takes 14-21 days for me to get the results back. Once I have them, we will call you to schedule a follow up phone visit with me to review them.   Please do not hesitate to call if you have any questions or concerns. You can also message me in MyChart.   Drake Leach PA-C [phone]     The physicians and staff at [facility] at [facility] are committed to providing excellent care. You may receive a survey asking for feedback about your experience at our office. We value you your feedback and appreciate you taking the time to to fill it out. The [facility] leadership team is also available to discuss your experience in person, feel free to contact us [phone].

## 2023-06-11 ENCOUNTER — Ambulatory Visit
Admission: RE | Admit: 2023-06-11 | Discharge: 2023-06-11 | Disposition: A | Discharge status: Home / Self Care | Source: Ambulatory Visit | Attending: Orthopedic Surgery | Admitting: Orthopedic Surgery

## 2023-06-11 DIAGNOSIS — M4316 Spondylolisthesis, lumbar region: Secondary | ICD-10-CM | POA: Insufficient documentation

## 2023-06-11 DIAGNOSIS — M5126 Other intervertebral disc displacement, lumbar region: Secondary | ICD-10-CM | POA: Diagnosis not present

## 2023-06-11 DIAGNOSIS — Z9889 Other specified postprocedural states: Secondary | ICD-10-CM

## 2023-06-11 DIAGNOSIS — M5416 Radiculopathy, lumbar region: Secondary | ICD-10-CM | POA: Diagnosis not present

## 2023-06-11 DIAGNOSIS — M47816 Spondylosis without myelopathy or radiculopathy, lumbar region: Secondary | ICD-10-CM

## 2023-06-11 DIAGNOSIS — M5137 Other intervertebral disc degeneration, lumbosacral region with discogenic back pain only: Secondary | ICD-10-CM | POA: Diagnosis not present

## 2023-06-11 DIAGNOSIS — M4805 Spinal stenosis, thoracolumbar region: Secondary | ICD-10-CM | POA: Diagnosis not present

## 2023-06-11 DIAGNOSIS — M5136 Other intervertebral disc degeneration, lumbar region with discogenic back pain only: Secondary | ICD-10-CM | POA: Diagnosis not present

## 2023-06-20 NOTE — Progress Notes (Signed)
 Telephone Visit- Progress Note: Referring Physician:  No referring provider defined for this encounter.  Primary Physician:  Jean Nurse, MD  This visit was performed via telephone.  Patient location: home Provider location: office  I spent a total of 10 minutes non-face-to-face activities for this visit on the date of this encounter including review of current clinical condition and response to treatment.    Patient has given verbal consent to this telephone visits and we reviewed the limitations of a telephone visit. Patient wishes to proceed.    Chief Complaint:  review imaging  History of Present Illness: Jean Khan is a 78 y.o. female has a history of HTN, fatty liver, depression, obesity. She has a colostomy.   She is s/p L3-S1 decompression by Dr. Myer Khan on 10/02/22.   She had left TKA by Dr. Ernest Khan on 04/02/23.   Last seen by me on 06/04/23 for left side LBP/buttock pain to her left thigh. She has known lumbar spondylosis with slip L3-L4 and L4-L5.   Phone visit scheduled to review her lumbar xrays and MRI.   She continues with more constant left sided LBP/left buttock pain into her left lateral thigh to her knee. Pain is worse with standing and walking. Pain is also worst at night- keeps her up. She has some improvement with ice pack.Occasional numbness. No tingling or weakness.    She does not smoke.    Bowel/Bladder Dysfunction: occasional bowel urgency. No incontinence.    Conservative measures:  Physical therapy: none  Multimodal medical therapy including regular antiinflammatories: tylenol  Injections: Done in the past   Past Surgery:  L3-S1 decompression by Dr. Myer Khan on 10/02/22   Exam: No exam done as this was a telephone encounter.     Imaging: Lumbar xrays dated 06/11/23:  FINDINGS: Multilevel degenerative disc disease with narrowing T12-L1, L1-L2 L3-L4, L4-L5 disc spaces with diffuse anterior osteophytes and mild posterior bony  spondylosis.   There is advanced degenerative facet disease L3-L4 L4-5 and L5-S1   No significant change compared with prior examination.   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 No significant change compared with prior examination.     Electronically Signed   By: Jean Khan M.D.   On: 06/18/2023 14:26    Lumbar MRI dated 06/11/23:  FINDINGS: Segmentation:  Standard.   Alignment:  Physiologic.   Vertebrae:  No fracture, evidence of discitis, or bone lesion.   Conus medullaris and cauda equina: Conus extends to the T12 level. Conus and cauda equina appear normal.   Paraspinal and other soft tissues: Negative.   Disc levels:   T12 - L1 small right paracentral bulging disc flattening the thecal sac without disc herniation canal stenosis or foraminal narrowing   L1 - L2 narrowing of the disc space with desiccated disc material with a central, right paracentral spondylitic small disc herniation flattening the thecal sac and encroaching the right neural foramina   L2 - L3 degenerative disc disease with desiccated disc material with a medium-sized central, right and left paracentral spondylitic disc herniation flattening the thecal sac and encroaching on both neural foramina. Unchanged since prior examination.   L3 - L4 narrowing of the disc space with desiccated disc material with circumferential bulging disc flattening the thecal sac and encroaching on both neural foramina   L4 - L5 narrowing of the disc space with desiccated disc material with a central left paracentral spondylitic small disc herniation bulging disc complex flattening the thecal sac with mild  left foraminal encroachment. No significant canal stenosis. Hypertrophic osteoarthritic changes of the facet joints. Prior left hemilaminectomy.   L5 - S1 degenerative disc disease L5-S1 with a medium to large sized central, right and left paracentral disc herniation flattening  the thecal sac with minimal bilateral foraminal narrowing, unchanged since prior examination. Prior left hemilaminectomy.   IMPRESSION: *Multilevel degenerative disc disease with disc herniations at L1-2, L2-3, L4-5 and L5-S1 as described above. *Prior left hemilaminectomy at L4-5 and L5-S1. *No significant change since prior examination.     Electronically Signed   By: Jean Khan M.D.   On: 06/18/2023 14:30  I have personally reviewed the images and agree with the above interpretation.  Assessment and Plan: [name] is s/p L3-S1 decompression by Dr. Myer Khan on 10/02/22.    She continues with more constant left sided LBP/left buttock pain into her left lateral thigh to her knee. Pain is worse with standing and walking. Pain is also worst at night- keeps her up. Occasional numbness. No tingling or weakness.    Known lumbar spondylosis and diffuse DDD with slip L3-L4 and L4-L5- no instability seen on xrays. She has multilevel disc bulging with mild bilateral foraminal stenosis L2-L4 and L5-S1, right foraminal stenosis L1-L2 an left foraminal stenosis L4-L5.   LBP likely due to spondylosis and DDD. Left leg pain may be from foraminal stenosis L4-L5.    Treatment options discussed with patient and following plan made:  - Recommend PT for her lumbar spine. She would like to start on her own. Lumbar exercises mailed to her. Stop any that make her pain worse.   - Referral to PMR at [facility] to consider lumbar injections. She has seen Dr. Yves Khan in the past.  - Follow up with me in 8 weeks and prn.   Jean Leach PA-C Neurosurgery

## 2023-06-23 ENCOUNTER — Encounter: Payer: Self-pay | Admitting: Orthopedic Surgery

## 2023-06-23 ENCOUNTER — Ambulatory Visit (INDEPENDENT_AMBULATORY_CARE_PROVIDER_SITE_OTHER): Admitting: Orthopedic Surgery

## 2023-06-23 DIAGNOSIS — M4316 Spondylolisthesis, lumbar region: Secondary | ICD-10-CM

## 2023-06-23 DIAGNOSIS — M51362 Other intervertebral disc degeneration, lumbar region with discogenic back pain and lower extremity pain: Secondary | ICD-10-CM

## 2023-06-23 DIAGNOSIS — M5416 Radiculopathy, lumbar region: Secondary | ICD-10-CM

## 2023-06-23 DIAGNOSIS — Z9889 Other specified postprocedural states: Secondary | ICD-10-CM | POA: Diagnosis not present

## 2023-06-23 DIAGNOSIS — M4726 Other spondylosis with radiculopathy, lumbar region: Secondary | ICD-10-CM

## 2023-06-23 DIAGNOSIS — M47816 Spondylosis without myelopathy or radiculopathy, lumbar region: Secondary | ICD-10-CM

## 2023-08-05 DIAGNOSIS — M5430 Sciatica, unspecified side: Secondary | ICD-10-CM | POA: Diagnosis not present

## 2023-08-05 DIAGNOSIS — M5489 Other dorsalgia: Secondary | ICD-10-CM | POA: Diagnosis not present

## 2023-08-15 NOTE — Progress Notes (Deleted)
 Referring Physician:  Little Riff, MD [address] [facility] [facility] [address] [facility] [place],  [place] [address]  Primary Physician:  Little Riff, MD  History of Present Illness: 08/15/2023 Jean Khan has a history of HTN, fatty liver, depression, obesity. She has a colostomy.  She is s/p L3-S1 decompression by Dr. Mont Antis on 10/02/22. She had left TKA by Dr. Aubry Blase on 04/02/23.   Did phone visit with me on 06/23/23. 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 She has multilevel disc bulging with mild bilateral foraminal stenosis L2-L4 and L5-S1, right foraminal stenosis L1-L2 an left foraminal stenosis L4-L5.   She was mailed lumbar HEP and was to see PMR at [facility] to discuss injections.   She has not seen PMR. She is under pain contract with her PCP for hydrocodone up to bid.   She is here for follow up.       She continues with more constant left sided LBP/left buttock pain into her left lateral thigh to her knee. Pain is worse with standing and walking. Pain is also worst at night- keeps her up. She has some improvement with ice pack.Occasional numbness. No tingling or weakness.   She does not smoke.   Bowel/Bladder Dysfunction: occasional bowel urgency. No incontinence.   Conservative measures:  Physical therapy: none  Multimodal medical therapy including regular antiinflammatories: tylenol   Injections: Done in the past  Past Surgery:  L3-S1 decompression by Dr. Mont Antis on 10/02/22  Jean Khan has no symptoms of cervical myelopathy.  The symptoms are causing a significant impact on the patient's life.   Review of Systems:  A 10 point review of systems is negative, except for the pertinent positives and negatives detailed in the HPI.  Past Medical History: Past Medical History:  Diagnosis Date   Arthritis    Back pain with sciatica    Bacteremia    Bacteroides fragilis infection    Colitis     Depression    Fatty liver    GERD (gastroesophageal reflux disease)    Headache    Hypertension    Iron deficiency anemia    Large bowel obstruction (HCC)    Lumbar spinal stenosis    Neurogenic claudication due to lumbar spinal stenosis    Obesity    Osteoarthritis of left knee    Primary osteoarthritis of right knee    Primary osteoarthritis of right knee    Thyroid nodule    Trigeminal neuralgia     Past Surgical History: Past Surgical History:  Procedure Laterality Date   APPENDECTOMY     CATARACT EXTRACTION W/PHACO Right 06/18/2022   Procedure: CATARACT EXTRACTION PHACO AND INTRAOCULAR LENS PLACEMENT (IOC) RIGHT  6.10  00:44.6;  Surgeon: Clair Crews, MD;  Location: [facility];  Service: Ophthalmology;  Laterality: Right;   CATARACT EXTRACTION W/PHACO Left 07/09/2022   Procedure: CATARACT EXTRACTION PHACO AND INTRAOCULAR LENS PLACEMENT (IOC) LEFT 6.30 00:33.4;  Surgeon: Clair Crews, MD;  Location: [facility];  Service: Ophthalmology;  Laterality: Left;   CESAREAN SECTION     COLECTOMY     COLONOSCOPY     COLOSTOMY     COLOSTOMY TAKEDOWN     08/09/2021   KNEE ARTHROPLASTY Left 04/02/2023   Procedure: COMPUTER ASSISTED TOTAL KNEE ARTHROPLASTY;  Surgeon: Arlyne Lame, MD;  Location: [facility];  Service: Orthopedics;  Laterality: Left;   laparoscopic enterolysis     laparoscopic sigmoid resection  laparoscopic splenix flexure resection     LUMBAR LAMINECTOMY/DECOMPRESSION MICRODISCECTOMY N/A 10/02/2022   Procedure: L3-S1 DECOMPRESSION;  Surgeon: Jodeen Munch, MD;  Location: [facility];  Service: Neurosurgery;  Laterality: N/A;    Allergies: Allergies as of 08/19/2023   (No Known Allergies)    Medications: Outpatient Encounter Medications as of 08/19/2023  Medication Sig   acetaminophen  (TYLENOL ) 500 MG tablet Take 500 mg by mouth every 6 (six) hours as needed.   amLODipine  (NORVASC ) 5 MG tablet Take 5 mg by mouth daily.   aspirin  81  MG chewable tablet Chew 1 tablet (81 mg total) by mouth 2 (two) times daily.   chlorhexidine  (HIBICLENS ) 4 % external liquid Apply 15 mLs (1 Application total) topically as directed for 30 doses. Use as directed daily for 5 days every other week for 6 weeks.   diphenhydrAMINE  (BENADRYL ) 25 mg capsule Take 25 mg by mouth at bedtime as needed for sleep.   hydrochlorothiazide  (MICROZIDE ) 12.5 MG capsule Take 12.5 mg by mouth daily.   lansoprazole (PREVACID) 15 MG capsule Take 15 mg by mouth daily as needed (Heartburn).   No facility-administered encounter medications on file as of 08/19/2023.    Social History: Social History   Tobacco Use   Smoking status: Never    Passive exposure: Never   Smokeless tobacco: Never  Vaping Use   Vaping status: Never Used  Substance Use Topics   Alcohol  use: Yes    Alcohol /week: 1.0 standard drink of alcohol     Types: 1 Glasses of wine per week   Drug use: Never    Family Medical History: Family History  Problem Relation Age of Onset   Ovarian cancer Daughter 61    Physical Examination: There were no vitals filed for this visit.     Awake, alert, oriented to person, place, and time.  Speech is clear and fluent. Fund of knowledge is appropriate.   Cranial Nerves: Pupils equal round and reactive to light.  Facial tone is symmetric.    Well healed lumbar incision. Right and left sided lower lumbar tenderness.   No abnormal lesions on exposed skin.   Strength: Side Iliopsoas Quads Hamstring PF DF EHL  R 5 5 5 5 5 5   L 5 5 5 5 5 5    Reflexes are 2+ and symmetric at the patella and achilles.    Clonus is not present.   Bilateral lower extremity sensation is intact to light touch.     Gait is normal.     Medical Decision Making  Imaging: No recent lumbar imaging.   Assessment and Plan: Jean Khan is s/p L3-S1 decompression by Dr. Mont Antis on 10/02/22.   She has more constant left sided LBP/left buttock pain into her left lateral  thigh to her knee. Pain is worse with standing and walking. Better with rest. She is doing well with her left TKA and would like to be more active- back is limiting her activity.   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   Treatment options discussed with patient and following plan made:   - MRI of lumbar spine ordered to evaluate new LBP/leg pain.  - Will get lumbar xrays with flex/ext as well.  - Depending on above results, may consider PT and/or injections.  - Will schedule phone visit to review MRI and xray results once I get them back.   I spent a total of 20 minutes in face-to-face and non-face-to-face activities related to this patient's care today including review  of outside records, review of imaging, review of symptoms, physical exam, discussion of differential diagnosis, discussion of treatment options, and documentation.   Lucetta Russel PA-C Dept. of Neurosurgery

## 2023-08-19 ENCOUNTER — Ambulatory Visit: Admitting: Orthopedic Surgery

## 2023-10-07 DIAGNOSIS — Z96652 Presence of left artificial knee joint: Secondary | ICD-10-CM | POA: Diagnosis not present

## 2023-10-07 DIAGNOSIS — M79662 Pain in left lower leg: Secondary | ICD-10-CM | POA: Diagnosis not present

## 2023-10-07 DIAGNOSIS — M1712 Unilateral primary osteoarthritis, left knee: Secondary | ICD-10-CM | POA: Diagnosis not present

## 2023-10-07 DIAGNOSIS — I10 Essential (primary) hypertension: Secondary | ICD-10-CM | POA: Diagnosis not present

## 2023-10-08 ENCOUNTER — Other Ambulatory Visit: Payer: Self-pay | Admitting: Student

## 2023-10-08 DIAGNOSIS — Z96652 Presence of left artificial knee joint: Secondary | ICD-10-CM

## 2023-10-08 DIAGNOSIS — M79662 Pain in left lower leg: Secondary | ICD-10-CM

## 2023-10-09 ENCOUNTER — Ambulatory Visit
Admission: RE | Admit: 2023-10-09 | Discharge: 2023-10-09 | Disposition: A | Discharge status: Home / Self Care | Source: Ambulatory Visit | Attending: Student | Admitting: Student

## 2023-10-09 DIAGNOSIS — Z96652 Presence of left artificial knee joint: Secondary | ICD-10-CM | POA: Insufficient documentation

## 2023-10-09 DIAGNOSIS — M79662 Pain in left lower leg: Secondary | ICD-10-CM | POA: Diagnosis not present

## 2023-10-14 DIAGNOSIS — I10 Essential (primary) hypertension: Secondary | ICD-10-CM | POA: Diagnosis not present

## 2023-10-14 DIAGNOSIS — G500 Trigeminal neuralgia: Secondary | ICD-10-CM | POA: Diagnosis not present

## 2023-10-14 DIAGNOSIS — K760 Fatty (change of) liver, not elsewhere classified: Secondary | ICD-10-CM | POA: Diagnosis not present

## 2023-10-14 DIAGNOSIS — F339 Major depressive disorder, recurrent, unspecified: Secondary | ICD-10-CM | POA: Diagnosis not present

## 2023-10-14 DIAGNOSIS — D509 Iron deficiency anemia, unspecified: Secondary | ICD-10-CM | POA: Diagnosis not present

## 2024-02-23 DIAGNOSIS — Z961 Presence of intraocular lens: Secondary | ICD-10-CM | POA: Diagnosis not present

## 2024-02-23 DIAGNOSIS — H43813 Vitreous degeneration, bilateral: Secondary | ICD-10-CM | POA: Diagnosis not present

## 2024-02-23 DIAGNOSIS — H02882 Meibomian gland dysfunction right lower eyelid: Secondary | ICD-10-CM | POA: Diagnosis not present

## 2024-02-23 DIAGNOSIS — H353131 Nonexudative age-related macular degeneration, bilateral, early dry stage: Secondary | ICD-10-CM | POA: Diagnosis not present
